# Patient Record
Sex: Female | Born: 1985 | Race: White | Hispanic: No | Marital: Married | Smoking: Current every day smoker
Health system: Southern US, Community
[De-identification: ages and names within clinical notes are randomized; demographics above are authoritative.]

## PROBLEM LIST (undated history)

## (undated) HISTORY — PX: TONSILLECTOMY: SUR1361

---
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
## 2007-03-26 ENCOUNTER — Emergency Department (HOSPITAL_COMMUNITY): Admission: EM | Admit: 2007-03-26 | Discharge: 2007-03-26 | Payer: Self-pay | Admitting: Emergency Medicine

## 2007-06-17 ENCOUNTER — Emergency Department (HOSPITAL_COMMUNITY): Admission: EM | Admit: 2007-06-17 | Discharge: 2007-06-17 | Payer: Self-pay | Admitting: Emergency Medicine

## 2007-08-14 ENCOUNTER — Emergency Department (HOSPITAL_COMMUNITY): Admission: EM | Admit: 2007-08-14 | Discharge: 2007-08-14 | Payer: Self-pay | Admitting: Emergency Medicine

## 2007-12-11 ENCOUNTER — Emergency Department (HOSPITAL_COMMUNITY): Admission: EM | Admit: 2007-12-11 | Discharge: 2007-12-11 | Payer: Self-pay | Admitting: Emergency Medicine

## 2008-04-27 ENCOUNTER — Other Ambulatory Visit: Admission: RE | Admit: 2008-04-27 | Discharge: 2008-04-27 | Payer: Self-pay | Admitting: Obstetrics & Gynecology

## 2008-05-05 ENCOUNTER — Ambulatory Visit (HOSPITAL_COMMUNITY): Admission: RE | Admit: 2008-05-05 | Discharge: 2008-05-05 | Payer: Self-pay | Admitting: Obstetrics & Gynecology

## 2008-06-05 ENCOUNTER — Emergency Department (HOSPITAL_COMMUNITY): Admission: EM | Admit: 2008-06-05 | Discharge: 2008-06-05 | Payer: Self-pay | Admitting: Emergency Medicine

## 2008-06-28 ENCOUNTER — Emergency Department (HOSPITAL_COMMUNITY): Admission: EM | Admit: 2008-06-28 | Discharge: 2008-06-28 | Payer: Self-pay | Admitting: Emergency Medicine

## 2008-07-15 ENCOUNTER — Emergency Department (HOSPITAL_COMMUNITY): Admission: EM | Admit: 2008-07-15 | Discharge: 2008-07-15 | Payer: Self-pay | Admitting: Emergency Medicine

## 2008-08-19 ENCOUNTER — Emergency Department (HOSPITAL_COMMUNITY): Admission: EM | Admit: 2008-08-19 | Discharge: 2008-08-19 | Payer: Self-pay | Admitting: Emergency Medicine

## 2008-08-25 ENCOUNTER — Emergency Department (HOSPITAL_COMMUNITY): Admission: EM | Admit: 2008-08-25 | Discharge: 2008-08-25 | Payer: Self-pay | Admitting: Emergency Medicine

## 2009-04-09 ENCOUNTER — Inpatient Hospital Stay (HOSPITAL_COMMUNITY): Admission: AD | Admit: 2009-04-09 | Discharge: 2009-04-09 | Payer: Self-pay | Admitting: Family Medicine

## 2009-04-09 ENCOUNTER — Ambulatory Visit: Payer: Self-pay | Admitting: Advanced Practice Midwife

## 2009-05-18 ENCOUNTER — Ambulatory Visit: Payer: Self-pay | Admitting: Obstetrics and Gynecology

## 2009-05-18 ENCOUNTER — Inpatient Hospital Stay (HOSPITAL_COMMUNITY): Admission: AD | Admit: 2009-05-18 | Discharge: 2009-05-21 | Payer: Self-pay | Admitting: Obstetrics & Gynecology

## 2009-06-17 ENCOUNTER — Inpatient Hospital Stay (HOSPITAL_COMMUNITY): Admission: AD | Admit: 2009-06-17 | Discharge: 2009-06-17 | Payer: Self-pay | Admitting: Obstetrics & Gynecology

## 2009-06-20 ENCOUNTER — Observation Stay (HOSPITAL_COMMUNITY): Admission: RE | Admit: 2009-06-20 | Discharge: 2009-06-22 | Payer: Self-pay | Admitting: Obstetrics and Gynecology

## 2009-08-14 ENCOUNTER — Emergency Department (HOSPITAL_COMMUNITY): Admission: EM | Admit: 2009-08-14 | Discharge: 2009-08-14 | Payer: Self-pay | Admitting: Emergency Medicine

## 2009-09-12 ENCOUNTER — Emergency Department (HOSPITAL_COMMUNITY): Admission: EM | Admit: 2009-09-12 | Discharge: 2009-09-13 | Payer: Self-pay | Admitting: Emergency Medicine

## 2009-09-27 ENCOUNTER — Emergency Department (HOSPITAL_COMMUNITY): Admission: EM | Admit: 2009-09-27 | Discharge: 2009-09-27 | Payer: Self-pay | Admitting: Emergency Medicine

## 2009-10-08 ENCOUNTER — Emergency Department (HOSPITAL_COMMUNITY): Admission: EM | Admit: 2009-10-08 | Discharge: 2009-10-08 | Payer: Self-pay | Admitting: Emergency Medicine

## 2010-01-26 ENCOUNTER — Inpatient Hospital Stay (HOSPITAL_COMMUNITY): Admission: AD | Admit: 2010-01-26 | Discharge: 2010-01-26 | Payer: Self-pay | Admitting: Obstetrics & Gynecology

## 2010-01-26 ENCOUNTER — Ambulatory Visit: Payer: Self-pay | Admitting: Nurse Practitioner

## 2010-02-27 ENCOUNTER — Emergency Department (HOSPITAL_COMMUNITY): Admission: EM | Admit: 2010-02-27 | Discharge: 2010-02-27 | Payer: Self-pay | Admitting: Emergency Medicine

## 2010-06-25 ENCOUNTER — Encounter: Payer: Self-pay | Admitting: Internal Medicine

## 2010-08-18 LAB — URINALYSIS, ROUTINE W REFLEX MICROSCOPIC
Bilirubin Urine: NEGATIVE
Ketones, ur: 15 mg/dL — AB
Nitrite: NEGATIVE
Protein, ur: NEGATIVE mg/dL
Urobilinogen, UA: 0.2 mg/dL (ref 0.0–1.0)
pH: 5.5 (ref 5.0–8.0)

## 2010-08-18 LAB — GC/CHLAMYDIA PROBE AMP, GENITAL
Chlamydia, DNA Probe: NEGATIVE
GC Probe Amp, Genital: NEGATIVE

## 2010-08-18 LAB — CBC
MCH: 31.1 pg (ref 26.0–34.0)
MCHC: 33.5 g/dL (ref 30.0–36.0)
Platelets: 346 10*3/uL (ref 150–400)
RDW: 17 % — ABNORMAL HIGH (ref 11.5–15.5)

## 2010-08-21 LAB — CBC
HCT: 37.9 % (ref 36.0–46.0)
Hemoglobin: 12.5 g/dL (ref 12.0–15.0)
MCHC: 33.1 g/dL (ref 30.0–36.0)
MCHC: 33.9 g/dL (ref 30.0–36.0)
MCV: 96.5 fL (ref 78.0–100.0)
RBC: 3.49 MIL/uL — ABNORMAL LOW (ref 3.87–5.11)
RBC: 3.92 MIL/uL (ref 3.87–5.11)
RDW: 14.9 % (ref 11.5–15.5)
RDW: 14.9 % (ref 11.5–15.5)

## 2010-08-21 LAB — DIFFERENTIAL
Basophils Absolute: 0 10*3/uL (ref 0.0–0.1)
Basophils Relative: 0 % (ref 0–1)
Eosinophils Absolute: 0.1 10*3/uL (ref 0.0–0.7)
Monocytes Absolute: 0.8 10*3/uL (ref 0.1–1.0)
Monocytes Relative: 7 % (ref 3–12)
Neutro Abs: 8.2 10*3/uL — ABNORMAL HIGH (ref 1.7–7.7)
Neutrophils Relative %: 68 % (ref 43–77)

## 2010-08-22 LAB — DIFFERENTIAL
Basophils Absolute: 0.1 10*3/uL (ref 0.0–0.1)
Basophils Relative: 1 % (ref 0–1)
Basophils Relative: 0 % (ref 0–1)
Eosinophils Absolute: 0.1 10*3/uL (ref 0.0–0.7)
Lymphocytes Relative: 36 % (ref 12–46)
Monocytes Absolute: 0.5 10*3/uL (ref 0.1–1.0)
Monocytes Relative: 7 % (ref 3–12)
Monocytes Relative: 8 % (ref 3–12)
Neutro Abs: 6.2 10*3/uL (ref 1.7–7.7)
Neutro Abs: 4 10*3/uL (ref 1.7–7.7)
Neutrophils Relative %: 61 % (ref 43–77)

## 2010-08-22 LAB — CBC
HCT: 41 % (ref 36.0–46.0)
HCT: 35.1 % — ABNORMAL LOW (ref 36.0–46.0)
Hemoglobin: 13.6 g/dL (ref 12.0–15.0)
Platelets: 323 10*3/uL (ref 150–400)
RBC: 4.4 MIL/uL (ref 3.87–5.11)
RDW: 16 % — ABNORMAL HIGH (ref 11.5–15.5)

## 2010-08-22 LAB — COMPREHENSIVE METABOLIC PANEL
ALT: 17 U/L (ref 0–35)
AST: 20 U/L (ref 0–37)
Albumin: 3.6 g/dL (ref 3.5–5.2)
Alkaline Phosphatase: 61 U/L (ref 39–117)
Alkaline Phosphatase: 52 U/L (ref 39–117)
BUN: 8 mg/dL (ref 6–23)
BUN: 5 mg/dL — ABNORMAL LOW (ref 6–23)
CO2: 29 mEq/L (ref 19–32)
Chloride: 102 mEq/L (ref 96–112)
GFR calc Af Amer: >60 mL/min (ref >60)
GFR calc non Af Amer: >60 mL/min (ref >60)
Glucose, Bld: 90 mg/dL (ref 70–99)
Potassium: 3.9 mEq/L (ref 3.5–5.1)
Potassium: 3.5 mEq/L (ref 3.5–5.1)
Sodium: 138 mEq/L (ref 135–145)
Total Bilirubin: 0.8 mg/dL (ref 0.3–1.2)
Total Protein: 6.2 g/dL (ref 6.0–8.3)

## 2010-08-22 LAB — RAPID URINE DRUG SCREEN, HOSP PERFORMED
Benzodiazepines: NOT DETECTED
Cocaine: NOT DETECTED
Cocaine: NOT DETECTED
Opiates: POSITIVE — AB
Tetrahydrocannabinol: POSITIVE — AB
Tetrahydrocannabinol: POSITIVE — AB

## 2010-08-22 LAB — POCT PREGNANCY, URINE: Preg Test, Ur: NEGATIVE

## 2010-08-22 LAB — URINALYSIS, ROUTINE W REFLEX MICROSCOPIC
Ketones, ur: NEGATIVE mg/dL
Nitrite: NEGATIVE
Urobilinogen, UA: 1 mg/dL (ref 0.0–1.0)

## 2010-08-22 LAB — PREGNANCY, URINE: Preg Test, Ur: NEGATIVE

## 2010-08-22 LAB — ETHANOL
Alcohol, Ethyl (B): <5 mg/dL (ref 0–10)
Alcohol, Ethyl (B): <5 mg/dL (ref 0–10)

## 2010-08-23 LAB — BASIC METABOLIC PANEL
BUN: 9 mg/dL (ref 6–23)
Creatinine, Ser: 0.73 mg/dL (ref 0.4–1.2)
GFR calc non Af Amer: >60 mL/min (ref >60)
Potassium: 3.7 mEq/L (ref 3.5–5.1)

## 2010-08-23 LAB — RAPID URINE DRUG SCREEN, HOSP PERFORMED
Amphetamines: NOT DETECTED
Barbiturates: NOT DETECTED
Benzodiazepines: NOT DETECTED
Opiates: POSITIVE — AB

## 2010-08-23 LAB — DIFFERENTIAL
Lymphocytes Relative: 34 % (ref 12–46)
Lymphs Abs: 2.9 10*3/uL (ref 0.7–4.0)
Neutro Abs: 4.7 10*3/uL (ref 1.7–7.7)
Neutrophils Relative %: 56 % (ref 43–77)

## 2010-08-23 LAB — CBC
Platelets: 315 10*3/uL (ref 150–400)
WBC: 8.4 10*3/uL (ref 4.0–10.5)

## 2010-09-04 LAB — CBC
MCHC: 34.3 g/dL (ref 30.0–36.0)
MCV: 100.6 fL — ABNORMAL HIGH (ref 78.0–100.0)
Platelets: 297 10*3/uL (ref 150–400)

## 2010-09-05 LAB — URINALYSIS, ROUTINE W REFLEX MICROSCOPIC
Glucose, UA: NEGATIVE mg/dL
Ketones, ur: NEGATIVE mg/dL
Specific Gravity, Urine: <1.005 — ABNORMAL LOW (ref 1.005–1.030)
pH: 6.5 (ref 5.0–8.0)

## 2010-09-05 LAB — RAPID URINE DRUG SCREEN, HOSP PERFORMED
Benzodiazepines: NOT DETECTED
Cocaine: NOT DETECTED
Opiates: POSITIVE — AB

## 2010-09-05 LAB — RPR: RPR Ser Ql: NONREACTIVE

## 2010-09-05 LAB — CBC
MCHC: 33.8 g/dL (ref 30.0–36.0)
MCV: 100 fL (ref 78.0–100.0)
Platelets: 343 10*3/uL (ref 150–400)
RDW: 14.3 % (ref 11.5–15.5)
WBC: 12.5 10*3/uL — ABNORMAL HIGH (ref 4.0–10.5)

## 2010-09-05 LAB — WET PREP, GENITAL: Yeast Wet Prep HPF POC: NONE SEEN

## 2010-09-06 LAB — URINALYSIS, ROUTINE W REFLEX MICROSCOPIC
Bilirubin Urine: NEGATIVE
Hgb urine dipstick: NEGATIVE
Ketones, ur: NEGATIVE mg/dL
Nitrite: NEGATIVE
Protein, ur: NEGATIVE mg/dL
Specific Gravity, Urine: 1.015 (ref 1.005–1.030)
Urobilinogen, UA: 0.2 mg/dL (ref 0.0–1.0)

## 2010-09-14 LAB — URINALYSIS, ROUTINE W REFLEX MICROSCOPIC
Bilirubin Urine: NEGATIVE
Glucose, UA: NEGATIVE mg/dL
Hgb urine dipstick: NEGATIVE
Specific Gravity, Urine: 1.009 (ref 1.005–1.030)
pH: 8 (ref 5.0–8.0)

## 2010-09-14 LAB — POCT PREGNANCY, URINE: Preg Test, Ur: NEGATIVE

## 2010-10-20 ENCOUNTER — Emergency Department (HOSPITAL_COMMUNITY)
Admission: EM | Admit: 2010-10-20 | Discharge: 2010-10-21 | Disposition: A | Discharge status: Home / Self Care | Payer: Self-pay | Attending: Emergency Medicine | Admitting: Emergency Medicine

## 2010-10-20 DIAGNOSIS — K089 Disorder of teeth and supporting structures, unspecified: Secondary | ICD-10-CM | POA: Insufficient documentation

## 2010-10-20 DIAGNOSIS — Z8614 Personal history of Methicillin resistant Staphylococcus aureus infection: Secondary | ICD-10-CM | POA: Insufficient documentation

## 2010-10-20 DIAGNOSIS — K029 Dental caries, unspecified: Secondary | ICD-10-CM | POA: Insufficient documentation

## 2011-02-26 LAB — BASIC METABOLIC PANEL
CO2: 25
Calcium: 8.3 — ABNORMAL LOW
Chloride: 109
Creatinine, Ser: 0.64
GFR calc Af Amer: >60
Glucose, Bld: 99

## 2011-02-26 LAB — RAPID URINE DRUG SCREEN, HOSP PERFORMED
Amphetamines: NOT DETECTED
Barbiturates: NOT DETECTED
Benzodiazepines: POSITIVE — AB
Opiates: NOT DETECTED

## 2011-03-14 LAB — WOUND CULTURE: Gram Stain: NONE SEEN

## 2012-01-03 ENCOUNTER — Other Ambulatory Visit: Payer: Self-pay

## 2012-08-12 ENCOUNTER — Inpatient Hospital Stay (HOSPITAL_COMMUNITY)
Admission: AD | Admit: 2012-08-12 | Discharge: 2012-08-12 | Discharge status: Against Medical Advice | Payer: Self-pay | Source: Ambulatory Visit | Attending: Obstetrics & Gynecology | Admitting: Obstetrics & Gynecology

## 2012-08-12 DIAGNOSIS — G8929 Other chronic pain: Secondary | ICD-10-CM | POA: Clinically undetermined

## 2012-08-12 DIAGNOSIS — N949 Unspecified condition associated with female genital organs and menstrual cycle: Secondary | ICD-10-CM | POA: Insufficient documentation

## 2012-08-12 DIAGNOSIS — N938 Other specified abnormal uterine and vaginal bleeding: Secondary | ICD-10-CM | POA: Insufficient documentation

## 2012-08-12 NOTE — MAU Note (Signed)
Period started 2 wks ago, started as spotting, off and on now is heavy. Changing every 2 hrs.

## 2012-08-12 NOTE — MAU Note (Signed)
Not in lobby

## 2012-08-12 NOTE — MAU Note (Signed)
Name and DOB verified.  correct Spelling confirmed  On ID band by pt.

## 2012-08-12 NOTE — MAU Note (Signed)
Not in lobby #2 

## 2012-08-12 NOTE — MAU Note (Signed)
Not in lobby #3 

## 2013-04-23 ENCOUNTER — Emergency Department (HOSPITAL_COMMUNITY)
Admission: EM | Admit: 2013-04-23 | Discharge: 2013-04-23 | Disposition: A | Discharge status: Home / Self Care | Payer: Medicaid Other | Attending: Emergency Medicine | Admitting: Emergency Medicine

## 2013-04-23 DIAGNOSIS — G479 Sleep disorder, unspecified: Secondary | ICD-10-CM | POA: Insufficient documentation

## 2013-04-23 DIAGNOSIS — K0889 Other specified disorders of teeth and supporting structures: Secondary | ICD-10-CM

## 2013-04-23 DIAGNOSIS — R110 Nausea: Secondary | ICD-10-CM | POA: Insufficient documentation

## 2013-04-23 DIAGNOSIS — K089 Disorder of teeth and supporting structures, unspecified: Secondary | ICD-10-CM | POA: Insufficient documentation

## 2013-04-23 MED ORDER — PENICILLIN V POTASSIUM 500 MG PO TABS: 500.0000 mg | ORAL_TABLET | Freq: Four times a day (QID) | ORAL | Status: DC

## 2013-04-23 MED ORDER — TRAMADOL HCL 50 MG PO TABS: 50.0000 mg | ORAL_TABLET | Freq: Four times a day (QID) | ORAL | Status: DC | PRN

## 2013-04-23 NOTE — ED Provider Notes (Signed)
Medical screening examination/treatment/procedure(s) were performed by non-physician practitioner and as supervising physician I was immediately available for consultation/collaboration.  Flint Melter, MD 04/23/13 [phone]

## 2013-04-23 NOTE — ED Provider Notes (Signed)
CSN: [id number]     Arrival date & time 04/23/13  1547 History  This chart was scribed for non-physician practitioner working with Flint Melter, MD by Ashley Jacobs, ED scribe. This patient was seen in room WTR6/WTR6 and the patient's care was started at 4:22 PM.   First MD Initiated Contact with Patient 04/23/13 1557     Chief Complaint  Patient presents with  . Dental Pain   (Consider location/radiation/quality/duration/timing/severity/associated sxs/prior Treatment) The history is provided by the patient and medical records. No language interpreter was used.   HPI Comments: Amber Dillon is a 27 y.o. female who presents to the Emergency Department complaining of left third molar pain for the past week. The pain is described as sharp, needle-like sensation that radiates throughout the left side of her face. She has tried Orajel with no relief. Pt has cold sensitivity, pain with talking and nausea. She denies fever and vomiting. Pt does not have any known allergies to medicine or any prior medical complications.   No past medical history on file. No past surgical history on file. No family history on file. History  Substance Use Topics  . Smoking status: Not on file  . Smokeless tobacco: Not on file  . Alcohol Use: Not on file   OB History   No data available     Review of Systems  Constitutional: Negative for fever and chills.  HENT: Positive for dental problem.   Gastrointestinal: Positive for nausea. Negative for vomiting.  Psychiatric/Behavioral: Positive for sleep disturbance.  All other systems reviewed and are negative.    Allergies  Review of patient's allergies indicates no known allergies.  Home Medications   Current Outpatient Rx  Name  Route  Sig  Dispense  Refill  . aspirin-acetaminophen-caffeine (EXCEDRIN MIGRAINE) 250-250-65 MG per tablet   Oral   Take 1-2 tablets by mouth every 6 (six) hours as needed for headache (toothpain).          BP  103/69  Pulse 93  Temp(Src) 98.1 F (36.7 C) (Oral)  Resp 16  SpO2 99% Physical Exam  Nursing note and vitals reviewed. Constitutional: She is oriented to person, place, and time. She appears well-developed and well-nourished. No distress.  HENT:  Head: Normocephalic and atraumatic.  Mouth/Throat: Uvula is midline and oropharynx is clear and moist.    No trismus, submental edema, tongue elevation. No drainable abscess.  Poor dentition, left lower molar is rotted out.   Eyes: EOM are normal. Pupils are equal, round, and reactive to light.  Neck: Normal range of motion. Neck supple. No tracheal deviation present.  Cardiovascular: Normal rate.   Pulmonary/Chest: Effort normal. No respiratory distress.  Abdominal: Soft. She exhibits no distension.  Musculoskeletal: Normal range of motion.  Neurological: She is alert and oriented to person, place, and time.  Skin: Skin is warm and dry.  Psychiatric: She has a normal mood and affect. Her behavior is normal.    ED Course  Dental Date/Time: 04/23/2013 4:36 PM Performed by: Mora Bellman Authorized by: Mora Bellman Consent: Verbal consent obtained. written consent not obtained. The procedure was performed in an emergent situation. Risks and benefits: risks, benefits and alternatives were discussed Consent given by: patient Patient understanding: patient states understanding of the procedure being performed Patient consent: the patient's understanding of the procedure matches consent given Required items: required blood products, implants, devices, and special equipment available Patient identity confirmed: verbally with patient and arm band Time out: Immediately prior to procedure  a "time out" was called to verify the correct patient, procedure, equipment, support staff and site/side marked as required. Local anesthesia used: yes Anesthesia: local infiltration Local anesthetic: bupivacaine 0.25% with epinephrine Anesthetic  total: 1.8 ml Patient sedated: no Patient tolerance: Patient tolerated the procedure well with no immediate complications.   (including critical care time) DIAGNOSTIC STUDIES: Oxygen Saturation is 99% on room air, normal by my interpretation.    COORDINATION OF CARE: 4:24 PM Discussed course of care with pt which includes dental block. Pt understands and agrees.   Labs Review Labs Reviewed - No data to display Imaging Review No results found.  EKG Interpretation   None       MDM   1. Pain, dental    Patient with toothache.  No gross abscess.  Exam unconcerning for Ludwig's angina or spread of infection.  Will treat with penicillin and pain medicine.  Urged patient to follow-up with dentist.     I personally performed the services described in this documentation, which was scribed in my presence. The recorded information has been reviewed and is accurate.     Mora Bellman, PA-C 04/23/13 [phone]

## 2013-04-23 NOTE — ED Notes (Signed)
Pt c/o dental pain to L side of mouth for one week. Pt states she has used orajel and Motrin to no avail. Pt with no acute distress.

## 2013-04-23 NOTE — Progress Notes (Signed)
   CARE MANAGEMENT ED NOTE 04/23/2013  Patient:  Amber Dillon, Amber Dillon   Account Number:  [id number]  Date Initiated:  04/23/2013  Documentation initiated by:  Edd Arbour  Subjective/Objective Assessment:   27 yr old female medicaid [place] access states pcp is [facility]     Subjective/Objective Assessment Detail:     Action/Plan:   EPIc updated   Action/Plan Detail:   Anticipated DC Date:  04/23/2013     Status Recommendation to Physician:   Result of Recommendation:    Other ED Services  Consult Working Plan    DC Planning Services  Other  PCP issues    Choice offered to / List presented to:            Status of service:  Completed, signed off  ED Comments:   ED Comments Detail:

## 2013-09-22 ENCOUNTER — Encounter (HOSPITAL_COMMUNITY): Payer: Self-pay | Admitting: Emergency Medicine

## 2013-09-22 ENCOUNTER — Emergency Department (HOSPITAL_COMMUNITY)
Admission: EM | Admit: 2013-09-22 | Discharge: 2013-09-22 | Disposition: A | Discharge status: Home / Self Care | Payer: Medicaid Other | Attending: Emergency Medicine | Admitting: Emergency Medicine

## 2013-09-22 DIAGNOSIS — M549 Dorsalgia, unspecified: Secondary | ICD-10-CM | POA: Insufficient documentation

## 2013-09-22 DIAGNOSIS — G5712 Meralgia paresthetica, left lower limb: Secondary | ICD-10-CM

## 2013-09-22 DIAGNOSIS — G8929 Other chronic pain: Secondary | ICD-10-CM | POA: Insufficient documentation

## 2013-09-22 DIAGNOSIS — F17200 Nicotine dependence, unspecified, uncomplicated: Secondary | ICD-10-CM | POA: Insufficient documentation

## 2013-09-22 DIAGNOSIS — G5710 Meralgia paresthetica, unspecified lower limb: Secondary | ICD-10-CM | POA: Insufficient documentation

## 2013-09-22 MED ORDER — GABAPENTIN 300 MG PO CAPS: 300.0000 mg | ORAL_CAPSULE | Freq: Every day | ORAL | Status: DC

## 2013-09-22 MED ORDER — OXYCODONE-ACETAMINOPHEN 5-325 MG PO TABS
1.0000 | ORAL_TABLET | Freq: Once | ORAL | Status: AC
  Administered 2013-09-22: 1 via ORAL
  Filled 2013-09-22: qty 1

## 2013-09-22 NOTE — ED Provider Notes (Signed)
CSN: [id number]     Arrival date & time 09/22/13  1757 History  This chart was scribed for non-physician practitioner, Coral CeoJessica Nakyiah Kuck, PA-C, working with Gavin PoundMichael Y. Oletta LamasGhim, MD by Smiley HousemanFallon Davis, ED Scribe. This patient was seen in room TR05C/TR05C and the patient's care was started at 7:55 PM.  Chief Complaint  Patient presents with  . Leg Pain   The history is provided by the patient. No language interpreter was used.   HPI Comments: Bronson IngBrittany Dillon is a 28 y.o. female who presents to the Emergency Department complaining of intermittent worsening left upper thigh pain that started about 2 months ago. Pt states the pain initially started about 4 years ago when she was pregnant and resolved, but within the last few months it has returned. Pt states the pain can be sharp or burning. Pt denies any injury to trauma. Pt states she has associated swelling. Has been taking Ibuprofen with no relief. She states ambulating is difficult due to the pain. Pain also worse with light palpation. Pt states she has back pain, which is her baseline. She denies pain radiating down the back of her leg. She denies bowel or bladder incontinence, dysuria, fevers, chills, nausea, emesis, abdominal pain, weakness or loss of sensation. States top of her thigh feels numb. Pt states she is otherwise healthy. She reports her gynecologist prescribed her medication for the pain in the past. Pt was informed it was related to her nerves.      History reviewed. No pertinent past medical history. History reviewed. No pertinent past surgical history. History reviewed. No pertinent family history. History  Substance Use Topics  . Smoking status: Current Every Day Smoker  . Smokeless tobacco: Not on file  . Alcohol Use: No   OB History   Grav Para Term Preterm Abortions TAB SAB Ect Mult Living                 Review of Systems  Constitutional: Negative for fever and chills.  Respiratory: Negative for shortness of breath.    Cardiovascular: Negative for chest pain and leg swelling.  Gastrointestinal: Negative for nausea, vomiting, abdominal pain and diarrhea.  Musculoskeletal: Positive for back pain (chronic). Negative for joint swelling, myalgias, neck pain and neck stiffness.       Left leg pain.  Skin: Negative for color change, rash and wound.  Neurological: Positive for numbness. Negative for weakness and headaches.  Psychiatric/Behavioral: Negative for behavioral problems and confusion.  All other systems reviewed and are negative.   Allergies  Review of patient's allergies indicates no known allergies.  Home Medications   Prior to Admission medications   Medication Sig Start Date End Date Taking? Authorizing Provider  aspirin-acetaminophen-caffeine (EXCEDRIN MIGRAINE) [id number] MG per tablet Take 1-2 tablets by mouth every 6 (six) hours as needed for headache (toothpain).   Yes Historical Provider, MD   Triage Vitals: BP 130/87  Pulse 88  Temp(Src) 98.4 F (36.9 C)  Resp 18  SpO2 97%  LMP 09/17/2013  Filed Vitals:   09/22/13 1805 09/22/13 2030  BP: 130/87 97/59  Pulse: 88 68  Temp: 98.4 F (36.9 C)   Resp: 18 16  SpO2: 97% 99%    Physical Exam  Nursing note and vitals reviewed. Constitutional: She is oriented to person, place, and time. She appears well-developed and well-nourished. No distress.  HENT:  Head: Normocephalic and atraumatic.  Right Ear: External ear normal.  Left Ear: External ear normal.  Mouth/Throat: Oropharynx is clear and moist.  Eyes: Conjunctivae are normal. Right eye exhibits no discharge. Left eye exhibits no discharge.  Neck: Neck supple.  Cardiovascular: Normal rate, regular rhythm, normal heart sounds and intact distal pulses.  Exam reveals no gallop and no friction rub.   No murmur heard. Dorsalis pedis pulses present and equal bilaterally  Pulmonary/Chest: Effort normal and breath sounds normal. No respiratory distress. She has no wheezes. She has no  rales. She exhibits no tenderness.  Abdominal: Soft. She exhibits no distension. There is no tenderness.  Musculoskeletal: Normal range of motion. She exhibits tenderness. She exhibits no edema.       Legs: Tenderness to palpation to the lateral and anterior left thigh. Pain worse with ambulation. Patient able to ambulate without difficulty or ataxia. ROM intact in the LE bilaterally. Strength 5/5 in the lower extremities. No leg edema, erythema, ecchymosis or wounds. Altered sensation on the lateral thigh compared to the medial, however, sensation is intact. No hip, knee, calf, ankle or foot tenderness. No lumbar tenderness to palpation.  Neurological: She is alert and oriented to person, place, and time.  Skin: Skin is warm and dry. She is not diaphoretic. No erythema.    ED Course  Procedures (including critical care time) DIAGNOSTIC STUDIES: Oxygen Saturation is 97% on RA, normal by my interpretation.    COORDINATION OF CARE: 8:26 PM-Will order Percocet.  Will discharge with gabapentin.  Patient informed of current plan of treatment and evaluation and agrees with plan.     MDM   [name] is a 28 y.o. female who presents to the Emergency Department complaining of intermittent worsening left upper thigh pain that started about 2 months ago. Pain possibly due to meralgia paresthetica or lateral femoral cutaneous neuropathy. Patient describes a burning sensation and altered sensation. No injuries or trauma. Patient neurovascularly intact. No evidence of an infections process/cellulitis. No evidence of a DVT. Patient has failed NSAID therapy at home with no relief. Will try starting gabapentin. Instructed to follow-up with PCP for further evaluation and management. Return precautions, discharge instructions, and follow-up was discussed with the patient before discharge.     Discharge Medication List as of 09/22/2013  8:28 PM    START taking these medications   Details  gabapentin  (NEURONTIN) 300 MG capsule Take 1 capsule (300 mg total) by mouth daily., Starting 09/22/2013, Until Discontinued, Print        Final impressions: 1. Meralgia paresthetica of left side      Luiz IronJessica Katlin Arohi Salvatierra PA-C   This patient was discussed with Dr. Jyl HeinzGhim       Geary Rufo K Donnesha Karg, PA-C 09/24/13 1234

## 2013-09-22 NOTE — ED Notes (Signed)
Per pt sts 2 months of upper left leg pain. sts pain and numbness. sts it actually started about 4 years ago when she was pregnant.

## 2013-09-22 NOTE — Discharge Instructions (Signed)
- Your symptoms may be due to metralgia paresthetica or lateral femoral cutaneous nerve entrapment  - This nerve supplies sensation to the surface of your outer thigh and can become compressed or "pinched." The lateral femoral cutaneous nerve is purely a sensory nerve and doesn't affect your ability to use your leg muscles. - Avoid wearing tight clothes or belts (this may make your symptoms worse) - Take Gabapentin for pain  - Return to the emergency department if you develop any changing/worsening condition, weakness, loss of bowel/bladder function, fever, or any other concerns (please read additional information regarding your condition below)   Emergency Department Resource Guide 1) Find a Doctor and Pay Out of Pocket Although you won't have to find out who is covered by your insurance plan, it is a good idea to ask around and get recommendations. You will then need to call the office and see if the doctor you have chosen will accept you as a new patient and what types of options they offer for patients who are self-pay. Some doctors offer discounts or will set up payment plans for their patients who do not have insurance, but you will need to ask so you aren't surprised when you get to your appointment.  2) Contact Your Local Health Department Not all health departments have doctors that can see patients for sick visits, but many do, so it is worth a call to see if yours does. If you don't know where your local health department is, you can check in your phone book. The CDC also has a tool to help you locate your state's health department, and many state websites also have listings of all of their local health departments.  3) Find a Walk-in Clinic If your illness is not likely to be very severe or complicated, you may want to try a walk in clinic. These are popping up all over the country in pharmacies, drugstores, and shopping centers. They're usually staffed by nurse practitioners or physician  assistants that have been trained to treat common illnesses and complaints. They're usually fairly quick and inexpensive. However, if you have serious medical issues or chronic medical problems, these are probably not your best option.  No Primary Care Doctor: - Call Health Connect at  [phone] - they can help you locate a primary care doctor that  accepts your insurance, provides certain services, etc. - Physician Referral Service- [phone]  Chronic Pain Problems: Organization         Address  Phone   Notes  [facility]  [phone]) [phone] Patients need to be referred by their primary care doctor.   Medication Assistance: Organization         Address  Phone   Notes  [facility] Medication [facility] [address] [place], [place] [address] [phone]) [phone] --Must be a resident of [facility] -- Must have NO insurance coverage whatsoever (no Medicaid/ Medicare, etc.) -- The pt. MUST have a primary care doctor that directs their care regularly and follows them in the community   MedAssist  [phone]   [employer]  [phone]    Agencies that provide inexpensive medical care: Organization         Address  Phone   Notes  [facility]  [phone]   [facility]    [phone]   [facility] [address] [place], [place] [address] [phone]) [phone]   [facility] [address], [place] [phone]) [phone]   Planned Parenthood    [phone]   [facility]    [phone](336) [phone]   [facility]  [address], [place] Phone:  [phone], Fax:  [phone] Hours of Operation:  9 am - 6 pm, M-F.  Also accepts Medicaid/Medicare and self-pay.  [facility]  [address], [place] Phone: [phone](336) [phone], Fax: [phone]. Hours of Operation:  8:30 am - 5:30 pm, M-F.  Also accepts  Medicaid and self-pay.  [facility] [place] [address], [place] Phone: [phone](336) [phone]   [facility] [address], [place], [place] [phone], Ext. [phone] Mondays & Thursdays: 7-9 AM.  First 15 patients are seen on a first come, first serve basis.    Medicaid-accepting [facility] Providers:  Organization         Address  Phone   Notes  [facility] [address], [place] [phone] Also accepts self-pay patients.  [facility] [address], [place]  [phone]) [phone]   [facility] [address], [place] [phone]) [phone]   [facility] [address] [place] [address], [place] [phone]) [phone]   Renaye RakersVeita Bland [address], [place]   [phone]) [phone] Only accepts [place] Access [place] patients after they have their name applied to their card.   Self-Pay (no insurance) in [facility] [place]:  Organization         Address  Phone   Notes  Sickle Cell Patients, [facility] [address], [place] [phone]   [facility] [address], [place] [phone]   [facility] [place]  [address], [place] [phone]) [phone]   [facility]/Dr. Osei-Bonsu  [address] [place] [address], [place] or [phone], [place] [phone]) [phone] Phone number for both [place] and [place] locations is the same.  [facility] [address], [place] [phone]) [phone]   [facility] [address] [place] [address], [place] or [address] [phone]) [phone]) [phone]   [facility] [address], [place] [phone]) [phone], phone; [phone]) [phone], fax Sees patients 1st and 3rd Saturday of every month.  Must not qualify for public or private insurance (i.e. Medicaid, Medicare, [place] Health Choice, Veterans' Benefits)  Household  income should be no more than 200% of the poverty level The clinic cannot treat you if you are pregnant or think you are pregnant  Sexually transmitted diseases are not treated at the clinic.    Dental Care: Organization         Address  Phone  Notes  [facility] Department of [facility] [address], [place] [phone]) [phone] Accepts children up to age 26 who are enrolled in [place] or [place] Health Choice; pregnant women with a Medicaid card; and children who have applied for Medicaid or [place] Health Choice, but were declined, whose parents can pay a reduced fee at time of service.  [facility] of [facility] [place]  [address], [place] [phone]) [phone] Accepts children up to age 26 who are enrolled in [place] or [place] Health Choice; pregnant women with a Medicaid card; and children who have  applied for Medicaid or [place] Health Choice, but were declined, whose parents can pay a reduced fee at time of service.  [facility]  [address] [place], [place] [phone] Patients are seen by appointment only. Walk-ins are not accepted. [facility] will see patients 2 years of age and older. Monday - Tuesday (8am-5pm) Most Wednesdays (8:30-5pm) $30 per visit, cash only  [facility] Access PRO[facility]  [address], [facility] [phone] Patients are seen by appointment only. Walk-ins are not accepted. [facility] will see patients 59 years of age and older. One Wednesday Evening (Monthly: Volunteer Based).  $30 per visit, cash only  [employer]  [phone] for adults; Children under age 8, call Graduate Pediatric Dentistry at [phone]. Children aged 32-14, please call [phone] to request a pediatric application.  Dental services are provided in all areas of dental care including fillings, crowns and bridges, complete and partial dentures, implants, gum  treatment, root canals, and extractions. Preventive care is also provided. Treatment is provided to both adults and children. Patients are selected via a lottery and there is often a waiting list.   [facility] [address] [place]  [phone] [email or web address]   [facility] [address] [place], [place] [phone], Ext. [phone] Second and Fourth Thursday of each month, opens at 6:30 AM; Clinic ends at 9 AM.  Patients are seen on a first-come first-served basis, and a limited number are seen during each clinic.   [facility]  [address] [place], [place] [phone]   Eligibility Requirements You must have lived in [place], [place], or [place] counties for at least the last three months.   You cannot be eligible for state or federal sponsored [place], including [employer], [place], or [employer].   You generally cannot be eligible for healthcare insurance through your employer.    How to apply: Eligibility screenings are held every Tuesday and Wednesday afternoon from 1:00 pm until 4:00 pm. You do not need an appointment for the interview!  [facility] [address], [place], [place] [phone]   [facility] Health Department  [phone]   [facility] Health Department  [phone]   [facility]  [phone]    Behavioral Health Resources in the Community: Intensive Outpatient Programs Organization         Address  Phone  Notes  [facility] [address], [place], [place] [phone]   [facility] Outpatient [address], [place], [place] [phone]   [facility]: [facility] Drug S[facility] [address], [place], [place]  [phone]   [facility] Mental Health [address],  [place], [place] [phone] or [phone]   Substance Abuse Resources Organization         Address  Phone  Notes  Alcohol and  Drug Services  [phone]   [facility]  [phone]   [facility]  [phone]   Floydene Flock  [phone]   Residential & Outpatient Substance Abuse Program  [phone]   Psychological Services Organization         Address  Phone  Notes  [facility]  [phone]   [facility]  [phone]   [facility] [address], [place] [phone] or [phone]    Mobile Crisis Teams Organization  Address  Phone  Notes  [facility], Mobile Crisis Care Unit  [phone]   [facility]  [address] [place], [place]   [facility] [address] [place] [phone]    Self-Help/Support Groups Organization         Address  Phone             Notes  [place] of [place] - variety of support groups  [place] Call for more information  Narcotics Anonymous (NA), [facility] [address], [employer] [place]  2 meetings at this location   Special educational needs teacher         Address  Phone  Notes  ASAP Residential Treatment [place],    [place]  [phone]   [facility]  [address], [place] [address]3810, [place], [place]   [place], [place] [phone] Admissions: 8am-3pm M-F  Incentives Substance [name] [address]    [place], [place] [phone]   [facility] [address] [place], [place], [place]   [facility] [address]  [place], [place]   [facility] Programs - Intensive Outpatient [address], [place], [place]   [facility] ([place] [address]  [place], [place] [phone] or [phone]   [facility] (RTS) [address], [place], [place] Accepts Medicaid  Fellowship  [place] [address],  [place] [phone] Substance Abuse/Addiction Treatment   [facility] Organization         Address  Phone  Notes  [facility]  [phone]   Domenic Schwab, PhD [address]lis [address]a [place], [place]   [phone] or [phone]   [place], [place] [phone]   [facility] [address], [place], [place] [phone] Insurance/Medicaid/sponsorship through [facility] and Families [address] [place]                                    [place], [place] [phone] [place] [address], [place] [phone]    Dr. Adele Schilder  [phone]   [facility] Dept. 1) [address], [place] 2) [place] 3)  [address], [place] [phone]  [phone]   [place] [phone] or [phone] (After Hours)

## 2013-09-26 NOTE — ED Provider Notes (Signed)
Medical screening examination/treatment/procedure(s) were performed by non-physician practitioner and as supervising physician I was immediately available for consultation/collaboration.  Slayton Lubitz Y. Roshaunda Starkey, MD 09/26/13 1416 

## 2014-05-29 ENCOUNTER — Emergency Department: Payer: Self-pay | Admitting: Emergency Medicine

## 2014-05-29 LAB — CBC
HCT: 35.6 % (ref 35.0–47.0)
HGB: 11.9 g/dL — ABNORMAL LOW (ref 12.0–16.0)
MCH: 32.3 pg (ref 26.0–34.0)
MCHC: 33.6 g/dL (ref 32.0–36.0)
MCV: 96 fL (ref 80–100)
Platelet: 374 10*3/uL (ref 150–440)
RBC: 3.7 10*6/uL — ABNORMAL LOW (ref 3.80–5.20)
RDW: 13.9 % (ref 11.5–14.5)
WBC: 15.8 10*3/uL — ABNORMAL HIGH (ref 3.6–11.0)

## 2014-05-29 LAB — DRUG SCREEN, URINE
Amphetamines, Ur Screen: NEGATIVE (ref ?–1000)
Barbiturates, Ur Screen: NEGATIVE (ref ?–200)
Benzodiazepine, Ur Scrn: NEGATIVE (ref ?–200)
COCAINE METABOLITE, UR ~~LOC~~: NEGATIVE (ref ?–300)
Cannabinoid 50 Ng, Ur ~~LOC~~: POSITIVE (ref ?–50)
MDMA (Ecstasy)Ur Screen: NEGATIVE (ref ?–500)
Methadone, Ur Screen: POSITIVE (ref ?–300)
OPIATE, UR SCREEN: NEGATIVE (ref ?–300)
PHENCYCLIDINE (PCP) UR S: NEGATIVE (ref ?–25)
TRICYCLIC, UR SCREEN: NEGATIVE (ref ?–1000)

## 2014-05-29 LAB — COMPREHENSIVE METABOLIC PANEL
ALT: 25 U/L
Albumin: 3.4 g/dL (ref 3.4–5.0)
Alkaline Phosphatase: 53 U/L
Anion Gap: 9 (ref 7–16)
BUN: 11 mg/dL (ref 7–18)
Bilirubin,Total: 0.7 mg/dL (ref 0.2–1.0)
CALCIUM: 8.7 mg/dL (ref 8.5–10.1)
CREATININE: 0.69 mg/dL (ref 0.60–1.30)
Chloride: 102 mmol/L (ref 98–107)
Co2: 24 mmol/L (ref 21–32)
EGFR (Non-African Amer.): >60
GLUCOSE: 128 mg/dL — AB (ref 65–99)
OSMOLALITY: 271 (ref 275–301)
POTASSIUM: 3.2 mmol/L — AB (ref 3.5–5.1)
SGOT(AST): 20 U/L (ref 15–37)
Sodium: 135 mmol/L — ABNORMAL LOW (ref 136–145)
Total Protein: 7.5 g/dL (ref 6.4–8.2)

## 2014-05-29 LAB — URINALYSIS, COMPLETE
BACTERIA: NONE SEEN
Bilirubin,UR: NEGATIVE
Glucose,UR: NEGATIVE mg/dL (ref 0–75)
LEUKOCYTE ESTERASE: NEGATIVE
Nitrite: NEGATIVE
Ph: 6 (ref 4.5–8.0)
Protein: 100
SPECIFIC GRAVITY: 1.033 (ref 1.003–1.030)

## 2014-05-29 LAB — ACETAMINOPHEN LEVEL: Acetaminophen: <2 ug/mL

## 2014-05-29 LAB — HCG, QUANTITATIVE, PREGNANCY: Beta Hcg, Quant.: 82906 m[IU]/mL — ABNORMAL HIGH

## 2014-05-29 LAB — SALICYLATE LEVEL: Salicylates, Serum: 1.8 mg/dL

## 2014-05-29 LAB — ETHANOL: Ethanol: <3 mg/dL

## 2014-06-04 NOTE — L&D Delivery Note (Signed)
Delivery Note  Requested by Dr. Eure to attend this Stat C-section delivery at an estimated 39 weeks by US GA due to abruption. Born to a G1P0 mother with no PNC. Bleeding started at 3:30 AM and presented to MAU. At delivery she was found to have a 25% abruption with fetal nuchal cord x 1.  Infant vigorous with good spontaneous cry. Routine NRP followed including warming, drying and stimulation. Apgars 8 / 9. Left in OR for skin-to-skin contact with mother, in care of CN staff. Care transferred to Pediatrician.  Amina Menchaca, DO  Neonatologist 

## 2014-06-11 ENCOUNTER — Emergency Department (HOSPITAL_COMMUNITY)
Admission: EM | Admit: 2014-06-11 | Discharge: 2014-06-11 | Discharge status: Absent without leave | Payer: Medicaid Other

## 2014-06-11 NOTE — ED Notes (Signed)
Patient called x 3 for triage. No answer.  

## 2014-06-11 NOTE — ED Notes (Signed)
Called for triage x2, no answer.

## 2014-12-12 ENCOUNTER — Inpatient Hospital Stay (HOSPITAL_COMMUNITY)
Admission: AD | Admit: 2014-12-12 | Discharge: 2014-12-15 | DRG: 766 | Disposition: A | Discharge status: Home / Self Care | Payer: Medicaid Other | Source: Ambulatory Visit | Attending: Obstetrics & Gynecology | Admitting: Obstetrics & Gynecology

## 2014-12-12 ENCOUNTER — Inpatient Hospital Stay (HOSPITAL_COMMUNITY): Payer: Medicaid Other | Admitting: Anesthesiology

## 2014-12-12 ENCOUNTER — Inpatient Hospital Stay (HOSPITAL_COMMUNITY): Payer: Medicaid Other

## 2014-12-12 ENCOUNTER — Inpatient Hospital Stay (HOSPITAL_COMMUNITY): Payer: Self-pay | Admitting: Anesthesiology

## 2014-12-12 ENCOUNTER — Encounter (HOSPITAL_COMMUNITY): Payer: Self-pay | Admitting: Certified Registered Nurse Anesthetist

## 2014-12-12 ENCOUNTER — Encounter (HOSPITAL_COMMUNITY): Payer: Self-pay | Admitting: *Deleted

## 2014-12-12 ENCOUNTER — Encounter (HOSPITAL_COMMUNITY)
Admission: AD | Disposition: A | Discharge status: Home / Self Care | Payer: Self-pay | Source: Ambulatory Visit | Attending: Obstetrics & Gynecology

## 2014-12-12 DIAGNOSIS — O4593 Premature separation of placenta, unspecified, third trimester: Principal | ICD-10-CM | POA: Diagnosis present

## 2014-12-12 DIAGNOSIS — Z3A39 39 weeks gestation of pregnancy: Secondary | ICD-10-CM

## 2014-12-12 DIAGNOSIS — O0933 Supervision of pregnancy with insufficient antenatal care, third trimester: Secondary | ICD-10-CM

## 2014-12-12 DIAGNOSIS — Z98891 History of uterine scar from previous surgery: Secondary | ICD-10-CM

## 2014-12-12 DIAGNOSIS — F17210 Nicotine dependence, cigarettes, uncomplicated: Secondary | ICD-10-CM | POA: Diagnosis present

## 2014-12-12 DIAGNOSIS — O99334 Smoking (tobacco) complicating childbirth: Secondary | ICD-10-CM | POA: Diagnosis present

## 2014-12-12 LAB — DIFFERENTIAL
BASOS ABS: 0 10*3/uL (ref 0.0–0.1)
BASOS PCT: 0 % (ref 0–1)
Eosinophils Absolute: 0.2 10*3/uL (ref 0.0–0.7)
Eosinophils Relative: 2 % (ref 0–5)
Lymphocytes Relative: 25 % (ref 12–46)
Lymphs Abs: 2.9 10*3/uL (ref 0.7–4.0)
MONO ABS: 1.2 10*3/uL — AB (ref 0.1–1.0)
Monocytes Relative: 10 % (ref 3–12)
NEUTROS ABS: 7.3 10*3/uL (ref 1.7–7.7)
Neutrophils Relative %: 63 % (ref 43–77)

## 2014-12-12 LAB — CBC
HCT: 30 % — ABNORMAL LOW (ref 36.0–46.0)
HEMOGLOBIN: 9.8 g/dL — AB (ref 12.0–15.0)
MCH: 30.3 pg (ref 26.0–34.0)
MCHC: 32.7 g/dL (ref 30.0–36.0)
MCV: 92.9 fL (ref 78.0–100.0)
PLATELETS: 263 10*3/uL (ref 150–400)
RBC: 3.23 MIL/uL — AB (ref 3.87–5.11)
RDW: 14.4 % (ref 11.5–15.5)
WBC: 11.6 10*3/uL — AB (ref 4.0–10.5)

## 2014-12-12 LAB — URINALYSIS, ROUTINE W REFLEX MICROSCOPIC
Bilirubin Urine: NEGATIVE
GLUCOSE, UA: NEGATIVE mg/dL
Ketones, ur: NEGATIVE mg/dL
Leukocytes, UA: NEGATIVE
NITRITE: NEGATIVE
Protein, ur: NEGATIVE mg/dL
Urobilinogen, UA: 1 mg/dL (ref 0.0–1.0)
pH: 6 (ref 5.0–8.0)

## 2014-12-12 LAB — RAPID URINE DRUG SCREEN, HOSP PERFORMED
Amphetamines: NOT DETECTED
Barbiturates: NOT DETECTED
Benzodiazepines: NOT DETECTED
Cocaine: NOT DETECTED
Opiates: NOT DETECTED
Tetrahydrocannabinol: NOT DETECTED

## 2014-12-12 LAB — RUBELLA SCREEN: Rubella: 1.47 index (ref >0.99)

## 2014-12-12 LAB — RAPID HIV SCREEN (HIV 1/2 AB+AG)
HIV 1/2 ANTIBODIES: NONREACTIVE
HIV-1 P24 Antigen - HIV24: NONREACTIVE

## 2014-12-12 LAB — ABO/RH: ABO/RH(D): A POS

## 2014-12-12 LAB — TYPE AND SCREEN
ABO/RH(D): A POS
Antibody Screen: NEGATIVE

## 2014-12-12 LAB — RPR: RPR Ser Ql: NONREACTIVE

## 2014-12-12 LAB — MRSA PCR SCREENING: MRSA by PCR: NEGATIVE

## 2014-12-12 LAB — URINE MICROSCOPIC-ADD ON

## 2014-12-12 SURGERY — Surgical Case
Anesthesia: Spinal

## 2014-12-12 MED ORDER — CITRIC ACID-SODIUM CITRATE 334-500 MG/5ML PO SOLN
ORAL | Status: AC
  Filled 2014-12-12: qty 15

## 2014-12-12 MED ORDER — KETOROLAC TROMETHAMINE 30 MG/ML IJ SOLN
30.0000 mg | Freq: Once | INTRAMUSCULAR | Status: AC
  Administered 2014-12-12: 30 mg via INTRAVENOUS

## 2014-12-12 MED ORDER — SIMETHICONE 80 MG PO CHEW
80.0000 mg | CHEWABLE_TABLET | ORAL | Status: DC
  Administered 2014-12-13 – 2014-12-14 (×3): 80 mg via ORAL
  Filled 2014-12-12 (×3): qty 1

## 2014-12-12 MED ORDER — MENTHOL 3 MG MT LOZG: 1.0000 | LOZENGE | OROMUCOSAL | Status: DC | PRN

## 2014-12-12 MED ORDER — ONDANSETRON HCL 4 MG/2ML IJ SOLN
INTRAMUSCULAR | Status: AC
  Filled 2014-12-12: qty 2

## 2014-12-12 MED ORDER — ACETAMINOPHEN 325 MG PO TABS: 650.0000 mg | ORAL_TABLET | ORAL | Status: DC | PRN

## 2014-12-12 MED ORDER — DIPHENHYDRAMINE HCL 25 MG PO CAPS: 25.0000 mg | ORAL_CAPSULE | Freq: Four times a day (QID) | ORAL | Status: DC | PRN

## 2014-12-12 MED ORDER — SUCCINYLCHOLINE CHLORIDE 20 MG/ML IJ SOLN
INTRAMUSCULAR | Status: AC
  Filled 2014-12-12: qty 1

## 2014-12-12 MED ORDER — FENTANYL CITRATE (PF) 250 MCG/5ML IJ SOLN
INTRAMUSCULAR | Status: AC
  Filled 2014-12-12: qty 5

## 2014-12-12 MED ORDER — PROMETHAZINE HCL 25 MG/ML IJ SOLN
12.5000 mg | Freq: Once | INTRAMUSCULAR | Status: AC
  Administered 2014-12-12: 12.5 mg via INTRAVENOUS
  Filled 2014-12-12: qty 1

## 2014-12-12 MED ORDER — SIMETHICONE 80 MG PO CHEW: 80.0000 mg | CHEWABLE_TABLET | ORAL | Status: DC | PRN

## 2014-12-12 MED ORDER — OXYCODONE-ACETAMINOPHEN 5-325 MG PO TABS
2.0000 | ORAL_TABLET | ORAL | Status: DC | PRN
Start: 2014-12-12 — End: 2014-12-15
  Administered 2014-12-12 (×3): 2 via ORAL
  Filled 2014-12-12 (×3): qty 2

## 2014-12-12 MED ORDER — 0.9 % SODIUM CHLORIDE (POUR BTL) OPTIME
TOPICAL | Status: DC | PRN
  Administered 2014-12-12: 1000 mL

## 2014-12-12 MED ORDER — FENTANYL CITRATE (PF) 100 MCG/2ML IJ SOLN
INTRAMUSCULAR | Status: DC | PRN
  Administered 2014-12-12: 250 ug via INTRAVENOUS

## 2014-12-12 MED ORDER — HYDROMORPHONE HCL 2 MG PO TABS
2.0000 mg | ORAL_TABLET | ORAL | Status: DC | PRN
Start: 2014-12-12 — End: 2014-12-15
  Administered 2014-12-13 (×5): 4 mg via ORAL
  Administered 2014-12-14: 2 mg via ORAL
  Administered 2014-12-14 – 2014-12-15 (×7): 4 mg via ORAL
  Filled 2014-12-12 (×12): qty 2

## 2014-12-12 MED ORDER — ZOLPIDEM TARTRATE 5 MG PO TABS: 5.0000 mg | ORAL_TABLET | Freq: Every evening | ORAL | Status: DC | PRN

## 2014-12-12 MED ORDER — GABAPENTIN 300 MG PO CAPS
300.0000 mg | ORAL_CAPSULE | Freq: Every day | ORAL | Status: DC
  Administered 2014-12-14 – 2014-12-15 (×2): 300 mg via ORAL
  Filled 2014-12-12 (×4): qty 1

## 2014-12-12 MED ORDER — METOCLOPRAMIDE HCL 5 MG/ML IJ SOLN: 10.0000 mg | Freq: Once | INTRAMUSCULAR | Status: DC | PRN

## 2014-12-12 MED ORDER — SIMETHICONE 80 MG PO CHEW
80.0000 mg | CHEWABLE_TABLET | Freq: Three times a day (TID) | ORAL | Status: DC
  Administered 2014-12-12 – 2014-12-15 (×8): 80 mg via ORAL
  Filled 2014-12-12 (×9): qty 1

## 2014-12-12 MED ORDER — DIBUCAINE 1 % RE OINT: 1.0000 "application " | TOPICAL_OINTMENT | RECTAL | Status: DC | PRN

## 2014-12-12 MED ORDER — IBUPROFEN 600 MG PO TABS
600.0000 mg | ORAL_TABLET | Freq: Four times a day (QID) | ORAL | Status: DC
  Administered 2014-12-12 – 2014-12-15 (×11): 600 mg via ORAL
  Filled 2014-12-12 (×11): qty 1

## 2014-12-12 MED ORDER — DEXAMETHASONE SODIUM PHOSPHATE 10 MG/ML IJ SOLN
INTRAMUSCULAR | Status: DC | PRN
  Administered 2014-12-12: 4 mg via INTRAVENOUS

## 2014-12-12 MED ORDER — OXYTOCIN 40 UNITS IN LACTATED RINGERS INFUSION - SIMPLE MED: 62.5000 mL/h | INTRAVENOUS | Status: AC

## 2014-12-12 MED ORDER — OXYCODONE-ACETAMINOPHEN 5-325 MG PO TABS: 1.0000 | ORAL_TABLET | ORAL | Status: DC | PRN

## 2014-12-12 MED ORDER — CITRIC ACID-SODIUM CITRATE 334-500 MG/5ML PO SOLN
ORAL | Status: DC | PRN
Start: 2014-12-12 — End: 2014-12-12
  Administered 2014-12-12: 30 mL via ORAL

## 2014-12-12 MED ORDER — PRENATAL MULTIVITAMIN CH
1.0000 | ORAL_TABLET | Freq: Every day | ORAL | Status: DC
  Administered 2014-12-12 – 2014-12-14 (×3): 1 via ORAL
  Filled 2014-12-12 (×3): qty 1

## 2014-12-12 MED ORDER — LACTATED RINGERS IV SOLN
INTRAVENOUS | Status: DC
  Administered 2014-12-12 – 2014-12-13 (×2): via INTRAVENOUS

## 2014-12-12 MED ORDER — SENNOSIDES-DOCUSATE SODIUM 8.6-50 MG PO TABS
2.0000 | ORAL_TABLET | ORAL | Status: DC
Start: 2014-12-13 — End: 2014-12-15
  Administered 2014-12-13 – 2014-12-14 (×3): 2 via ORAL
  Filled 2014-12-12 (×3): qty 2

## 2014-12-12 MED ORDER — TETANUS-DIPHTH-ACELL PERTUSSIS 5-2.5-18.5 LF-MCG/0.5 IM SUSP: 0.5000 mL | Freq: Once | INTRAMUSCULAR | Status: DC

## 2014-12-12 MED ORDER — SUCCINYLCHOLINE CHLORIDE 20 MG/ML IJ SOLN
INTRAMUSCULAR | Status: DC | PRN
  Administered 2014-12-12: 120 mg via INTRAVENOUS

## 2014-12-12 MED ORDER — OXYTOCIN 10 UNIT/ML IJ SOLN
INTRAMUSCULAR | Status: AC
Start: 2014-12-12 — End: 2014-12-12
  Filled 2014-12-12: qty 4

## 2014-12-12 MED ORDER — MIDAZOLAM HCL 2 MG/2ML IJ SOLN
INTRAMUSCULAR | Status: DC | PRN
  Administered 2014-12-12: 2 mg via INTRAVENOUS

## 2014-12-12 MED ORDER — HYDROMORPHONE HCL 1 MG/ML IJ SOLN
INTRAMUSCULAR | Status: AC
  Administered 2014-12-12: 0.5 mg via INTRAVENOUS
  Filled 2014-12-12: qty 1

## 2014-12-12 MED ORDER — METHYLERGONOVINE MALEATE 0.2 MG/ML IJ SOLN: 0.2000 mg | INTRAMUSCULAR | Status: DC | PRN

## 2014-12-12 MED ORDER — HYDROMORPHONE HCL 1 MG/ML IJ SOLN
INTRAMUSCULAR | Status: AC
  Filled 2014-12-12: qty 1

## 2014-12-12 MED ORDER — WITCH HAZEL-GLYCERIN EX PADS: 1.0000 "application " | MEDICATED_PAD | CUTANEOUS | Status: DC | PRN

## 2014-12-12 MED ORDER — LACTATED RINGERS IV SOLN
INTRAVENOUS | Status: DC | PRN
  Administered 2014-12-12: 10:00:00 via INTRAVENOUS

## 2014-12-12 MED ORDER — PNEUMOCOCCAL VAC POLYVALENT 25 MCG/0.5ML IJ INJ
0.5000 mL | INJECTION | INTRAMUSCULAR | Status: DC
  Filled 2014-12-12: qty 0.5

## 2014-12-12 MED ORDER — MIDAZOLAM HCL 2 MG/2ML IJ SOLN
INTRAMUSCULAR | Status: AC
  Filled 2014-12-12: qty 2

## 2014-12-12 MED ORDER — ONDANSETRON HCL 4 MG/2ML IJ SOLN
INTRAMUSCULAR | Status: DC | PRN
  Administered 2014-12-12: 4 mg via INTRAVENOUS

## 2014-12-12 MED ORDER — CEFAZOLIN SODIUM-DEXTROSE 2-3 GM-% IV SOLR
INTRAVENOUS | Status: DC | PRN
  Administered 2014-12-12: 2 g via INTRAVENOUS

## 2014-12-12 MED ORDER — PROPOFOL 10 MG/ML IV BOLUS
INTRAVENOUS | Status: AC
Start: 2014-12-12 — End: 2014-12-12
  Filled 2014-12-12: qty 20

## 2014-12-12 MED ORDER — DEXAMETHASONE SODIUM PHOSPHATE 10 MG/ML IJ SOLN
INTRAMUSCULAR | Status: AC
  Filled 2014-12-12: qty 1

## 2014-12-12 MED ORDER — HYDROMORPHONE HCL 1 MG/ML IJ SOLN
0.2500 mg | INTRAMUSCULAR | Status: DC | PRN
  Administered 2014-12-12: 0.5 mg via INTRAVENOUS
  Administered 2014-12-12: 0.25 mg via INTRAVENOUS
  Administered 2014-12-12 (×3): 0.5 mg via INTRAVENOUS

## 2014-12-12 MED ORDER — KETOROLAC TROMETHAMINE 30 MG/ML IJ SOLN
INTRAMUSCULAR | Status: AC
  Filled 2014-12-12: qty 1

## 2014-12-12 MED ORDER — METHYLERGONOVINE MALEATE 0.2 MG PO TABS: 0.2000 mg | ORAL_TABLET | ORAL | Status: DC | PRN

## 2014-12-12 MED ORDER — NALBUPHINE HCL 10 MG/ML IJ SOLN
10.0000 mg | Freq: Once | INTRAMUSCULAR | Status: AC
  Administered 2014-12-12: 10 mg via INTRAVENOUS
  Filled 2014-12-12: qty 1

## 2014-12-12 MED ORDER — LANOLIN HYDROUS EX OINT: 1.0000 "application " | TOPICAL_OINTMENT | CUTANEOUS | Status: DC | PRN

## 2014-12-12 MED ORDER — MEPERIDINE HCL 25 MG/ML IJ SOLN: 6.2500 mg | INTRAMUSCULAR | Status: DC | PRN

## 2014-12-12 MED ORDER — PROPOFOL 10 MG/ML IV BOLUS
INTRAVENOUS | Status: DC | PRN
  Administered 2014-12-12: 180 mg via INTRAVENOUS

## 2014-12-12 SURGICAL SUPPLY — 36 items
CLAMP CORD UMBIL (MISCELLANEOUS) IMPLANT
CLOTH BEACON ORANGE TIMEOUT ST (SAFETY) ×3 IMPLANT
DRAPE SHEET LG 3/4 BI-LAMINATE (DRAPES) IMPLANT
DRSG OPSITE POSTOP 4X10 (GAUZE/BANDAGES/DRESSINGS) ×3 IMPLANT
DURAPREP 26ML APPLICATOR (WOUND CARE) ×6 IMPLANT
ELECT REM PT RETURN 9FT ADLT (ELECTROSURGICAL) ×3
ELECTRODE REM PT RTRN 9FT ADLT (ELECTROSURGICAL) ×1 IMPLANT
EXTRACTOR VACUUM BELL STYLE (SUCTIONS) IMPLANT
GLOVE BIOGEL PI IND STRL 8 (GLOVE) ×1 IMPLANT
GLOVE BIOGEL PI INDICATOR 8 (GLOVE) ×2
GLOVE ECLIPSE 8.0 STRL XLNG CF (GLOVE) ×3 IMPLANT
GOWN STRL REUS W/TWL LRG LVL3 (GOWN DISPOSABLE) ×6 IMPLANT
KIT ABG SYR 3ML LUER SLIP (SYRINGE) ×3 IMPLANT
LIQUID BAND (GAUZE/BANDAGES/DRESSINGS) ×6 IMPLANT
NDL HYPO 18GX1.5 BLUNT FILL (NEEDLE) ×1 IMPLANT
NDL HYPO 25X5/8 SAFETYGLIDE (NEEDLE) ×1 IMPLANT
NEEDLE HYPO 18GX1.5 BLUNT FILL (NEEDLE) ×3 IMPLANT
NEEDLE HYPO 22GX1.5 SAFETY (NEEDLE) ×3 IMPLANT
NEEDLE HYPO 25X5/8 SAFETYGLIDE (NEEDLE) ×3 IMPLANT
NS IRRIG 1000ML POUR BTL (IV SOLUTION) ×5 IMPLANT
PACK C SECTION WH (CUSTOM PROCEDURE TRAY) ×3 IMPLANT
PAD OB MATERNITY 4.3X12.25 (PERSONAL CARE ITEMS) ×3 IMPLANT
RTRCTR C-SECT PINK 25CM LRG (MISCELLANEOUS) IMPLANT
STAPLER VISISTAT 35W (STAPLE) ×2 IMPLANT
SUT CHROMIC 0 CT 1 (SUTURE) ×3 IMPLANT
SUT MNCRL 0 VIOLET CTX 36 (SUTURE) ×2 IMPLANT
SUT MONOCRYL 0 CTX 36 (SUTURE) ×4
SUT PLAIN 2 0 (SUTURE)
SUT PLAIN 2 0 XLH (SUTURE) IMPLANT
SUT PLAIN ABS 2-0 CT1 27XMFL (SUTURE) IMPLANT
SUT VIC AB 0 CTX 36 (SUTURE) ×3
SUT VIC AB 0 CTX36XBRD ANBCTRL (SUTURE) ×1 IMPLANT
SUT VIC AB 4-0 KS 27 (SUTURE) IMPLANT
SYR 20CC LL (SYRINGE) ×6 IMPLANT
TOWEL OR 17X24 6PK STRL BLUE (TOWEL DISPOSABLE) ×3 IMPLANT
TRAY FOLEY CATH SILVER 14FR (SET/KITS/TRAYS/PACK) IMPLANT

## 2014-12-12 NOTE — Anesthesia Postprocedure Evaluation (Signed)
  Anesthesia Post-op Note  Patient: Amber Dillon  Procedure(s) Performed: Procedure(s): CESAREAN SECTION (N/A)  Patient Location: Mother/Baby  Anesthesia Type:General  Level of Consciousness: awake  Airway and Oxygen Therapy: Patient Spontanous Breathing  Post-op Pain: mild  Post-op Assessment: Post-op Vital signs reviewed and Patient's Cardiovascular Status Stable              Post-op Vital Signs: stable  Last Vitals:  Filed Vitals:   12/12/14 1437  BP: 110/54  Pulse: 77  Temp: 36.7 C  Resp: 18    Complications: No apparent anesthesia complications

## 2014-12-12 NOTE — MAU Note (Signed)
Pt presents to MAU with complaints of contractions that started around 330 this morning with some bloody show. Reports she has had a couple of visits at the Health [facility] but has not had an ultrasound this pregnancy. States her last menstrual cycle was October the 7th.

## 2014-12-12 NOTE — Addendum Note (Signed)
Addendum  created 12/12/14 1457 by Renford DillsJanet L Katye Valek, CRNA   Modules edited: Notes Section   Notes Section:  File: [id number]

## 2014-12-12 NOTE — H&P (Signed)
Amber ArenasBrittany N Meldrum is a 29 y.o. female G2P1001 presenting for vag bleeding and abd pain that started at 0330 this a.m. No PNC. Maternal Medical History:  Reason for admission: Contractions.  Presents with abd pain and vag bleeding.    OB History    Gravida Para Term Preterm AB TAB SAB Ectopic Multiple Living   1              No past medical history on file. No past surgical history on file. Family History: family history is not on file. Social History:  reports that she has been smoking.  She does not have any smokeless tobacco history on file. She reports that she does not drink alcohol or use illicit drugs.   Prenatal Transfer Tool  Maternal Diabetes: No Genetic Screening: Declined Maternal Ultrasounds/Referrals: Declined Fetal Ultrasounds or other Referrals:  None Maternal Substance Abuse:  No Significant Maternal Medications:  None Significant Maternal Lab Results:  None Other Comments:  None  Review of Systems  Constitutional: Negative.   HENT: Negative.   Eyes: Negative.   Respiratory: Negative.   Cardiovascular: Negative.   Gastrointestinal: Positive for abdominal pain.  Genitourinary:       Sm amt bright vag bleeding  Skin: Negative.   Neurological: Negative.   Endo/Heme/Allergies: Negative.   Psychiatric/Behavioral: Negative.       Last menstrual period 03/10/2014. Maternal Exam:  Uterine Assessment: Contraction strength is moderate.  Contraction frequency is regular.   Abdomen: Patient reports generalized tenderness.  Fundal height is 34.   Estimated fetal weight is 5lbs.   Fetal presentation: vertex  Introitus: Normal vulva. Normal vagina.  Ferning test: not done.  Nitrazine test: not done. Amniotic fluid character: not assessed.  Pelvis: adequate for delivery.   Cervix: Cervix evaluated by digital exam.     Fetal Exam Fetal Monitor Review: Mode: ultrasound.   Variability: moderate (6-25 bpm).   Pattern: variable decelerations.    Fetal State  Assessment: Category II - tracings are indeterminate.     Physical Exam  Constitutional: She is oriented to person, place, and time. She appears well-developed and well-nourished.  HENT:  Head: Normocephalic.  Eyes: Pupils are equal, round, and reactive to light.  Neck: Normal range of motion.  Cardiovascular: Normal rate, regular rhythm, normal heart sounds and intact distal pulses.   Respiratory: Effort normal and breath sounds normal.  GI: Soft. Bowel sounds are normal. There is generalized tenderness.  Genitourinary: Vagina normal and uterus normal.  Musculoskeletal: Normal range of motion.  Neurological: She is alert and oriented to person, place, and time. She has normal reflexes.  Skin: Skin is warm and dry.  Psychiatric: She has a normal mood and affect. Her behavior is normal. Judgment and thought content normal.    Prenatal labs: ABO, Rh:   Antibody:   Rubella:   RPR:    HBsAg:    HIV:    GBS:     Assessment/Plan: No PNC, vag bleeding in pregnancy and abd pain. SVE ft/firm/post -2 P: Dr. Despina HiddenEure consulted and orders given. Will start IV, get labs and UDS and close obs.   LAWSON, MARIE DARLENE 12/12/2014, 9:10 AM

## 2014-12-12 NOTE — Progress Notes (Signed)
Notified pt bleeding, no pnc, will come see pt.

## 2014-12-12 NOTE — Anesthesia Preprocedure Evaluation (Addendum)
Anesthesia Evaluation  Patient identified by MRN, date of birth, ID band Patient awake    Reviewed: Allergy & Precautions, H&P , NPO status , Patient's Chart, lab work & pertinent test results  Airway Mallampati: II  TM Distance: >3 FB Neck ROM: Full    Dental no notable dental hx. (+) Teeth Intact   Pulmonary neg pulmonary ROS, Current Smoker,  breath sounds clear to auscultation  Pulmonary exam normal       Cardiovascular negative cardio ROS Normal cardiovascular examRhythm:Regular Rate:Normal     Neuro/Psych negative neurological ROS  negative psych ROS   GI/Hepatic negative GI ROS, Neg liver ROS,   Endo/Other  negative endocrine ROS  Renal/GU negative Renal ROS     Musculoskeletal   Abdominal   Peds  Hematology negative hematology ROS (+)   Anesthesia Other Findings   Reproductive/Obstetrics (+) Pregnancy                            Anesthesia Physical Anesthesia Plan  ASA: II and emergent  Anesthesia Plan: Spinal   Post-op Pain Management:    Induction:   Airway Management Planned:   Additional Equipment:   Intra-op Plan:   Post-operative Plan:   Informed Consent: I have reviewed the patients History and Physical, chart, labs and discussed the procedure including the risks, benefits and alternatives for the proposed anesthesia with the patient or authorized representative who has indicated his/her understanding and acceptance.   Dental Advisory Given  Plan Discussed with: Anesthesiologist, CRNA and Surgeon  Anesthesia Plan Comments:         Anesthesia Quick Evaluation

## 2014-12-12 NOTE — Op Note (Signed)
Preoperative diagnosis:  1.  Intrauterine pregnancy at 1966w4d  weeks gestation                                         2.  Placental abruption with repetitive fetal heart rate declerations, variables vs late stage late decelerations                                         3.  NO PNC   Postoperative diagnosis:  Same as above plus 25% or so abruption with fetal nuchal cord x 1  Procedure:  Caesarean section  Surgeon:  Lazaro ArmsLuther H Samanvitha Germany MD  Assistant:    Anesthesia: general  Findings:  . Pt presented to the MAU with complaint of bleeding and severe abdominal pain, started about 0330 this am.  On presentation FHR strip was significant for prolonged variables vs late stage lates, HR to the 70s with no variability.  Sonogram with EGS 37 weeks no sonographic evidence of abruption was seen.  I rechecked cervix 2 cm, thick ROM and significant blood.  I made the decision to do a STAT Caesareans section.   Over a low transverse incision was delivered a viable female with Apgars of pending and pending weighing pending lbs. pending oz.  There was a large amount of blood in the amniotic cavity and there was a nuchal cord x 1, tight. The placenta was examined and i estimated a 25% abruption. Uterus, tubes and ovaries were all normal.  There were no other significant findings  Description of operation:  Patient was taken to the operating room emergently where she underwent a general anesthetic.  prior to induction of anesthesia she was prepped and draped in usual sterile fashion and a Foley catheter was placed. A Pfannenstiel skin incision was made and carried down sharply to the rectus fascia which was scored in the midline extended laterally. The fascia was taken off the muscles both superiorly and without difficulty. The muscles were divided.  The peritoneal cavity was entered.  Bladder blade was placed, no bladder flap was created.  A low transverse hysterotomy incision was made and delivered a viable female   infant at [id number] with Apgars of pending and pending weighingpending lbs pending oz.  Cord pH was obtained and was pending. The uterus was exteriorized. It was closed in 2 layers, the first being a running interlocking layer and the second being an imbricating layer using 0 monocryl on a CTX needle. There was good resulting hemostasis. The uterus tubes and ovaries were all normal. Peritoneal cavity was irrigated vigorously. The muscles and peritoneum were reapproximated loosely. The fascia was closed using 0 Vicryl in running fashion. Subcutaneous tissue was made hemostatic and irrigated. The skin was closed using skin staples due to my concern of possible post abruption coagulopathy.  Blood loss for the procedure was 500 cc. The patient received a gram of Ancef prophylactically. The patient was taken to the recovery room in good stable condition with all counts being correct x3.  EBL 500 cc  Rajinder Mesick H 12/12/2014 10:16 AM

## 2014-12-12 NOTE — Anesthesia Postprocedure Evaluation (Signed)
  Anesthesia Post-op Note  Patient: Amber ArenasBrittany N Safi  Procedure(s) Performed: Procedure(s): CESAREAN SECTION (N/A)  Patient Location: PACU  Anesthesia Type:General  Level of Consciousness: awake, alert  and oriented  Airway and Oxygen Therapy: Patient Spontanous Breathing and Patient connected to nasal cannula oxygen  Post-op Pain: mild  Post-op Assessment: Post-op Vital signs reviewed, Patient's Cardiovascular Status Stable, Respiratory Function Stable, Patent Airway, No signs of Nausea or vomiting and Pain level controlled              Post-op Vital Signs: Reviewed and stable  Last Vitals:  Filed Vitals:   12/12/14 1145  BP: 102/70  Pulse: 75  Temp: 36.7 C  Resp: 16    Complications: No apparent anesthesia complications

## 2014-12-12 NOTE — Transfer of Care (Signed)
Immediate Anesthesia Transfer of Care Note  Patient: Amber Dillon  Procedure(s) Performed: Procedure(s): CESAREAN SECTION (N/A)  Patient Location: PACU  Anesthesia Type:General  Level of Consciousness: awake, alert  and oriented  Airway & Oxygen Therapy: Patient Spontanous Breathing and Patient connected to nasal cannula oxygen  Post-op Assessment: Report given to RN, Post -op Vital signs reviewed and stable and Patient moving all extremities  Post vital signs: Reviewed and stable  Last Vitals:  Filed Vitals:   12/12/14 0906  BP: 108/71  Pulse: 70  Resp: 18    Complications: No apparent anesthesia complications

## 2014-12-12 NOTE — Anesthesia Procedure Notes (Signed)
Procedure Name: Intubation Date/Time: 12/12/2014 9:54 AM Performed by: Elgie CongoMALINOVA, Mariella Blackwelder H Pre-anesthesia Checklist: Patient identified, Emergency Drugs available, Suction available and Patient being monitored Patient Re-evaluated:Patient Re-evaluated prior to inductionOxygen Delivery Method: Circle system utilized Preoxygenation: Pre-oxygenation with 100% oxygen Intubation Type: IV induction Laryngoscope Size: Glidescope Grade View: Grade I Tube type: Oral Tube size: 7.0 mm Number of attempts: 1 Airway Equipment and Method: Stylet Placement Confirmation: ETT inserted through vocal cords under direct vision,  positive ETCO2 and breath sounds checked- equal and bilateral Secured at: 21 cm Tube secured with: Tape Dental Injury: Teeth and Oropharynx as per pre-operative assessment

## 2014-12-13 ENCOUNTER — Encounter (HOSPITAL_COMMUNITY): Payer: Self-pay | Admitting: Obstetrics & Gynecology

## 2014-12-13 LAB — HEPATITIS B SURFACE ANTIGEN: HEP B S AG: NEGATIVE

## 2014-12-13 LAB — CBC
HEMATOCRIT: 24.1 % — AB (ref 36.0–46.0)
Hemoglobin: 7.9 g/dL — ABNORMAL LOW (ref 12.0–15.0)
MCH: 31 pg (ref 26.0–34.0)
MCHC: 32.8 g/dL (ref 30.0–36.0)
MCV: 94.5 fL (ref 78.0–100.0)
PLATELETS: 272 10*3/uL (ref 150–400)
RBC: 2.55 MIL/uL — ABNORMAL LOW (ref 3.87–5.11)
RDW: 14.2 % (ref 11.5–15.5)
WBC: 15.2 10*3/uL — AB (ref 4.0–10.5)

## 2014-12-13 NOTE — Progress Notes (Signed)
Subjective: Postpartum Day 1: Cesarean Delivery Patient reports incisional pain, tolerating PO and no problems voiding.    Objective: Vital signs in last 24 hours: Temp:  [97.6 F (36.4 C)-98.2 F (36.8 C)] 98.2 F (36.8 C) (07/10 1945) Pulse Rate:  [66-97] 85 (07/10 1945) Resp:  [15-20] 20 (07/10 1945) BP: (102-112)/(54-72) 104/58 mmHg (07/10 1945) SpO2:  [97 %-100 %] 98 % (07/10 1945) Weight:  [160 lb (72.576 kg)] 160 lb (72.576 kg) (07/10 0906)  Physical Exam:  General: alert, cooperative, appears stated age and no distress Lochia: appropriate Uterine Fundus: firm Incision: healing well, no significant drainage, no dehiscence, no significant erythema DVT Evaluation: No evidence of DVT seen on physical exam. Negative Homan's sign. No cords or calf tenderness.   Recent Labs  12/12/14 0912 12/13/14 0543  HGB 9.8* 7.9*  HCT 30.0* 24.1*    Assessment/Plan: Status post Cesarean section. Doing well postoperatively.  Continue current care.  Amber Dillon, Amber Dillon 12/13/2014, 7:18 AM

## 2014-12-13 NOTE — Progress Notes (Signed)
Patient voiding small amounts. Bladder Scan done at 0700 with result of 55 ml scanned. Marlynn Perking Lawson CNM and resident given report of patients progress regarding intake and low output. Patient encouraged to drink fluids and IV Lactated Ringers to continue at 125 ml/hr.

## 2014-12-14 ENCOUNTER — Encounter (HOSPITAL_COMMUNITY): Payer: Self-pay | Admitting: Obstetrics & Gynecology
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
## 2014-12-15 MED ORDER — IBUPROFEN 600 MG PO TABS
600.0000 mg | ORAL_TABLET | Freq: Four times a day (QID) | ORAL | Status: DC
Start: 2014-12-15 — End: 2018-11-19

## 2014-12-15 MED ORDER — HYDROMORPHONE HCL 2 MG PO TABS: 2.0000 mg | ORAL_TABLET | ORAL | Status: DC | PRN

## 2014-12-15 NOTE — Progress Notes (Signed)
Dr. Alvester MorinNewton notified that there were no orders to remove staples prior to DC. She stated she will put in an order for the baby love nurse to remove staple for the patient at home. No order at this time yet.

## 2014-12-15 NOTE — Discharge Summary (Signed)
Obstetric Discharge Summary Reason for Admission: onset of labor Prenatal Procedures: NST Intrapartum Procedures: cesarean: low cervical, transverse Postpartum Procedures: none Complications-Operative and Postpartum: Placental Abruption HEMOGLOBIN  Date Value Ref Range Status  12/13/2014 7.9* 12.0 - 15.0 g/dL Final   HGB  Date Value Ref Range Status  05/29/2014 11.9* 12.0-16.0 g/dL Final   HCT  Date Value Ref Range Status  12/13/2014 24.1* 36.0 - 46.0 % Final  05/29/2014 35.6 35.0-47.0 % Final  Hospital Course:  Expand All Collapse All   Amber Dillon is a 29 y.o. female G2P1001 presenting for vag bleeding and abd pain that started at 0330 this a.m. No PNC. Maternal Medical History:  Reason for admission: Contractions.  Presents with abd pain and vag bleeding.       Expand All Collapse All   Preoperative diagnosis: 1. Intrauterine pregnancy at 4824w4d weeks gestation  2. Placental abruption with repetitive fetal heart rate declerations, variables vs late stage late decelerations  3. NO PNC   Postoperative diagnosis: Same as above plus 25% or so abruption with fetal nuchal cord x 1  Procedure: Caesarean section  Surgeon: Lazaro ArmsLuther H Eure MD  Assistant:   Anesthesia: general  Findings: . Pt presented to the MAU with complaint of bleeding and severe abdominal pain, started about 0330 this am. On presentation FHR strip was significant for prolonged variables vs late stage lates, HR to the 70s with no variability. Sonogram with EGS 37 weeks no sonographic evidence of abruption was seen. I rechecked cervix 2 cm, thick ROM and significant blood. I made the decision to do a STAT Caesareans section.  Over a low transverse incision was delivered a viable female with Apgars of pending and pending weighing pending lbs. pending oz. There was a large amount of blood in the amniotic cavity and  there was a nuchal cord x 1, tight. The placenta was examined and i estimated a 25% abruption. Uterus, tubes and ovaries were all normal. There were no other significant findings     She has done well postoperatively.  She has required Dilaudid for pain due to tolerance of Percocet. UDS was negative. Has decided to Bottle feed. Was seen by Social Work yesterday and evaluations for CPS referral are ongoing. She has had a bowel movement and is tolerating POs. She is deemed ready for discharge.   Physical Exam:  General: alert, cooperative and no distress Lochia: appropriate Uterine Fundus: firm Incision: healing well, no significant drainage, no dehiscence DVT Evaluation: No evidence of DVT seen on physical exam.  Discharge Diagnoses: Term Pregnancy-delivered and Placental Abruption, No prenatal care, History of Substance Abuse  Discharge Information: Date: 12/15/2014 Activity: unrestricted and pelvic rest Diet: routine Medications: PNV, Ibuprofen and Dilaudid 2-4mg  q4 hrs PRN #30, no refills Condition: stable and improved Instructions: refer to practice specific booklet Discharge to: home Follow-up Information    [facility] Schedule an appointment as soon as possible for a visit in 4 weeks.   Contact information:   [address] [place] [id number] [phone]      Newborn Data: Live born female  Birth Weight: 6 lb 3.5 oz (2821 g) APGAR: 8, 9  Home with mother and Depending on Peds assessment and Social work input.  [facility]ILLIAMS,Makyle Eslick 12/15/2014, 7:40 AM

## 2014-12-15 NOTE — Progress Notes (Signed)
Baby love Nurse called and left a messsage for staple removal.

## 2014-12-15 NOTE — Plan of Care (Signed)
Problem: Discharge Progression Outcomes Goal: Remove staples per MD order Outcome: Not Applicable Date Met:  45/85/92 Baby love nurse will remove staples in 7 days.

## 2014-12-15 NOTE — Discharge Instructions (Signed)

## 2015-09-18 IMAGING — US US OB COMP +14 WK
1 series · 13 of 26 positions shown · non-contrast
Comparison: none

[Series 1: us ob comp +14 wk · 26 acquisitions, 13 frames shown]
[im 2/26]
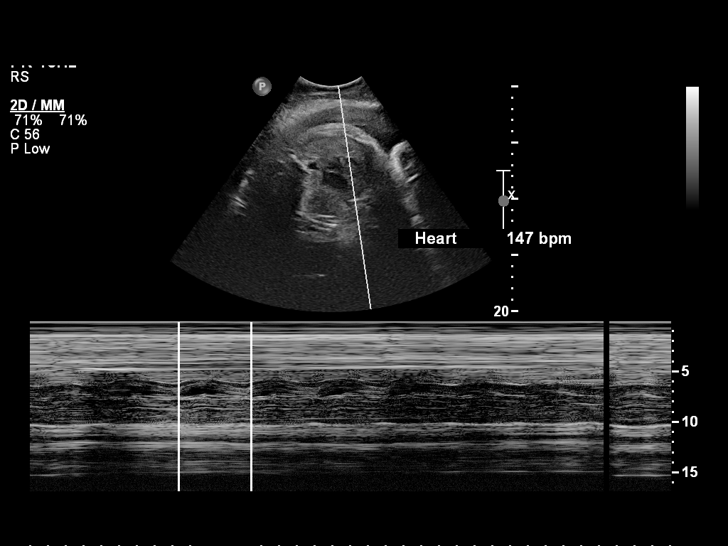
[im 4/26]
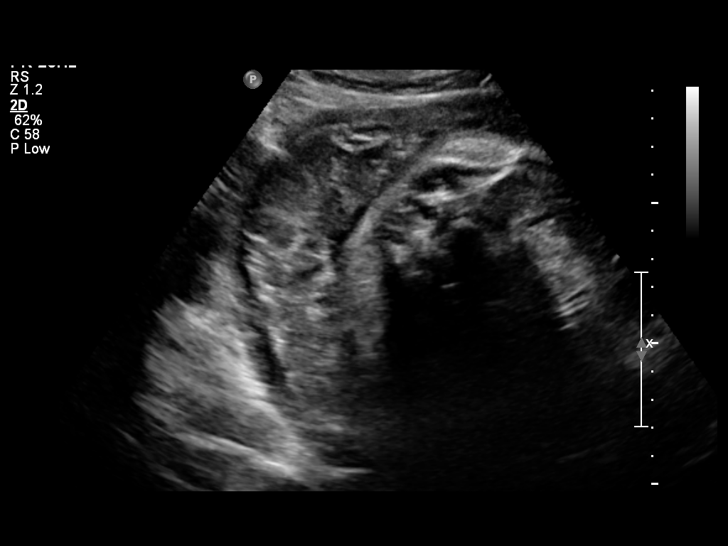
[im 6/26]
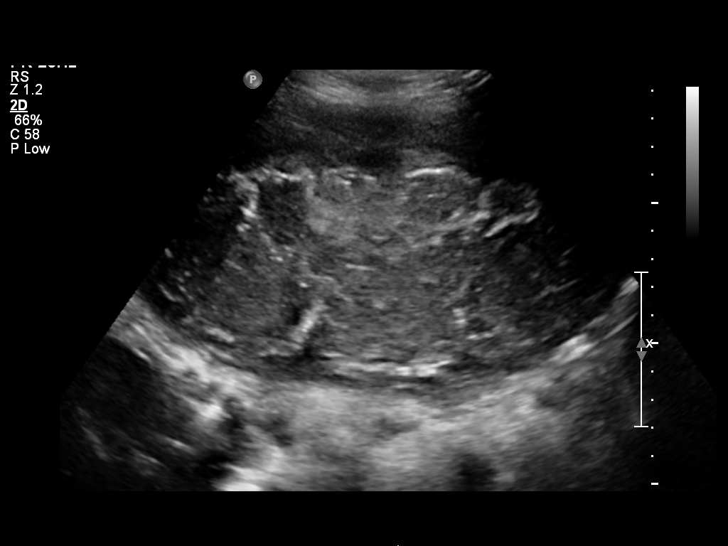
[im 8/26]
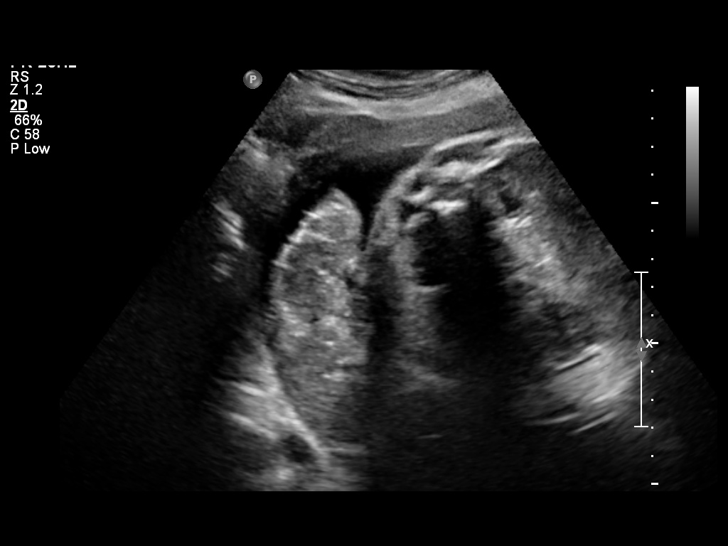
[im 10/26]
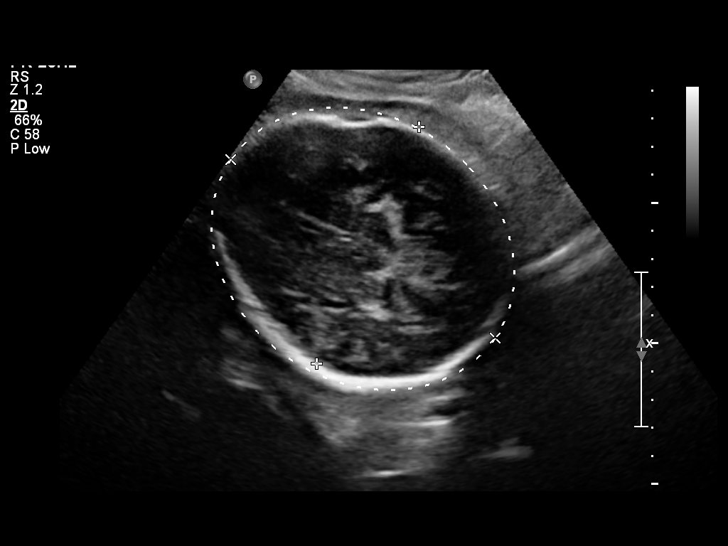
[im 12/26]
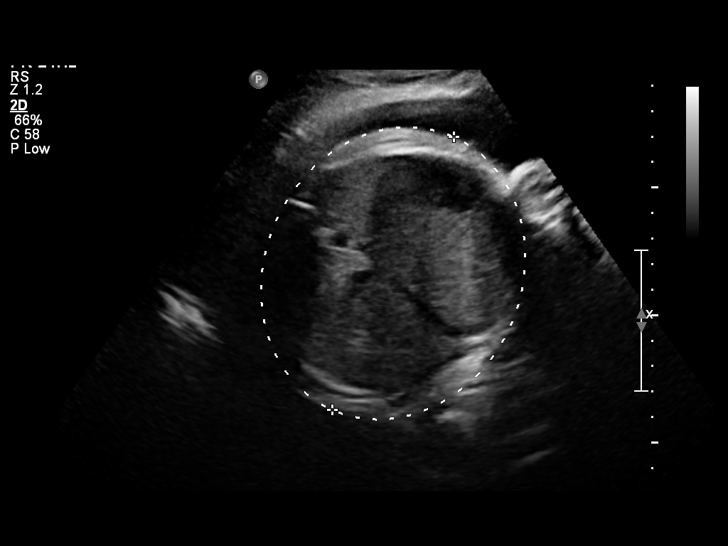
[im 14/26]
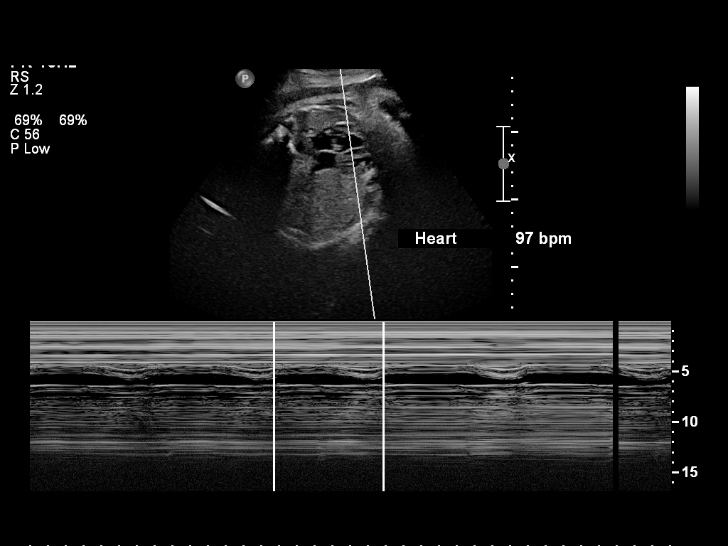
[im 16/26]
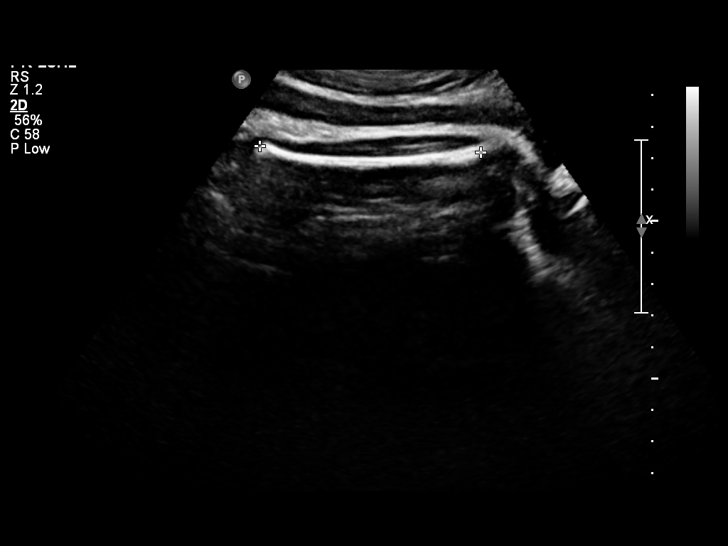
[im 18/26]
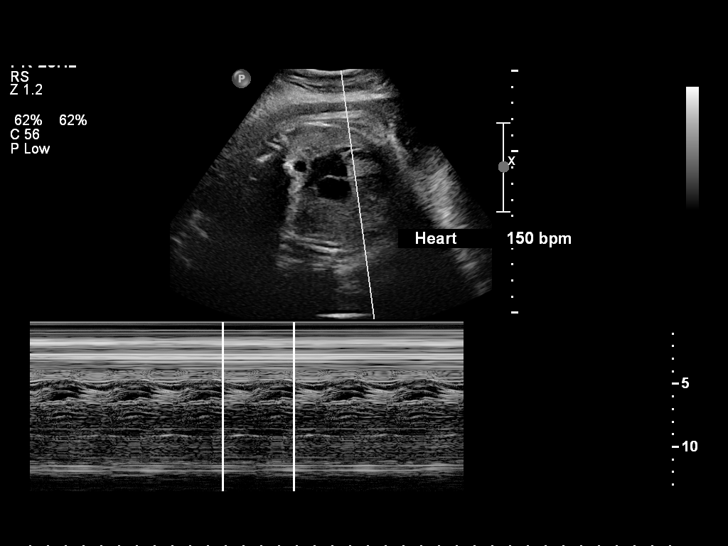
[im 20/26]
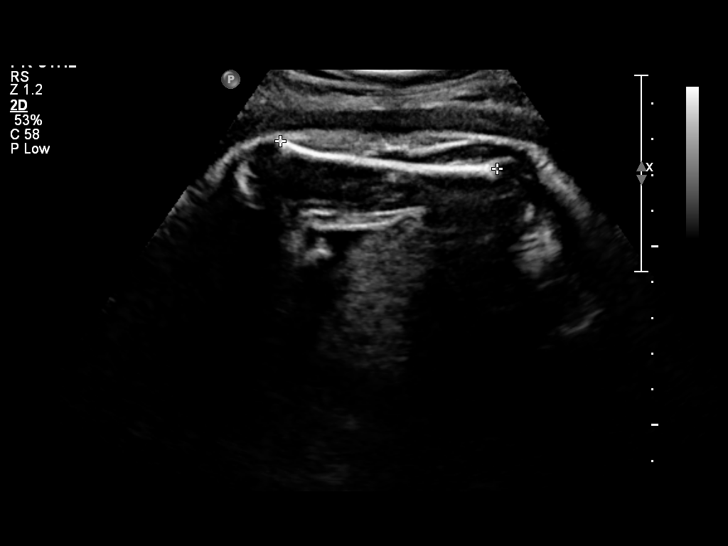
[im 22/26]
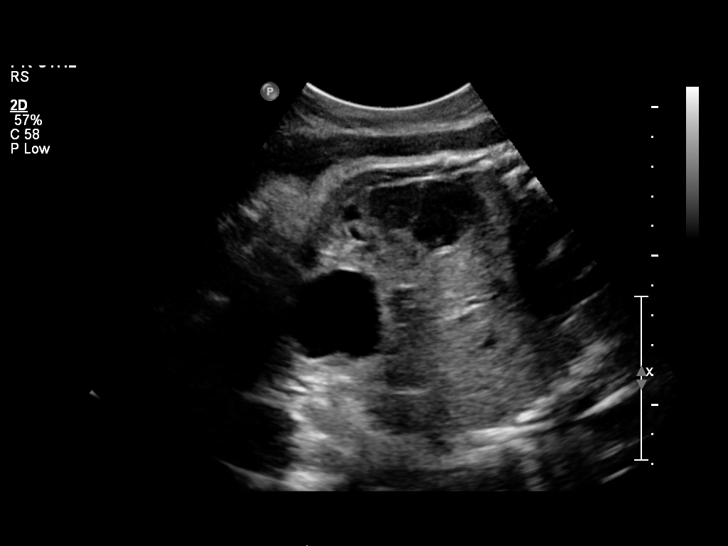
[im 24/26]
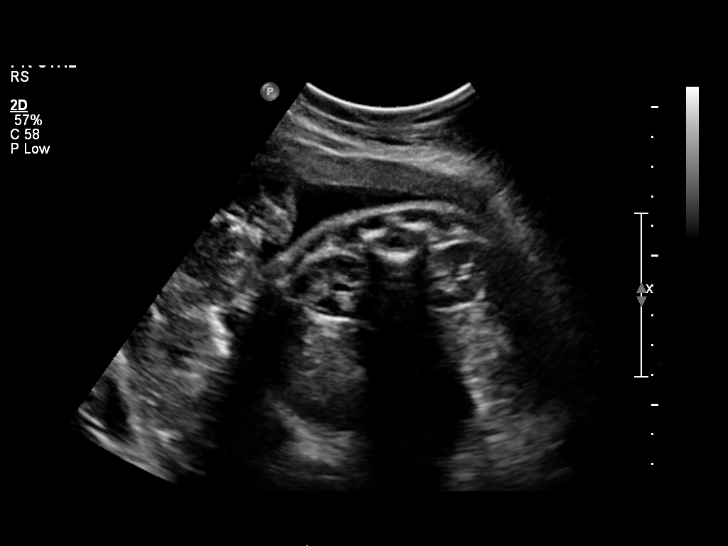
[im 26/26]
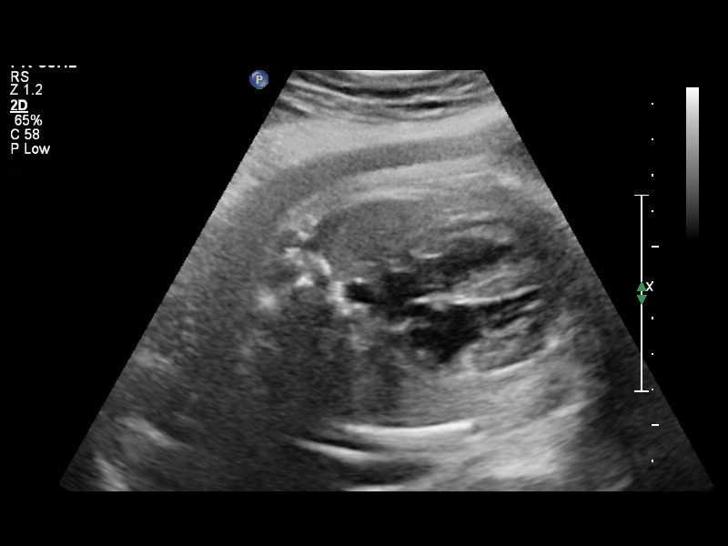

[13 of 26 positions shown; findings below may reference images not displayed]

OBSTETRICS REPORT
(Signed Final 12/12/2014 [DATE])

Service(s) Provided

US OB COMP + 14 WK                                    76805.1
Indications

No Prenatal Care
Vaginal bleeding, unknown etiology
Non-reactive NST, FHR decelerations
Abdominal pain in pregnancy
Assess fetal growth
Cigarette smoker
Uncertain LMP  Establish Gestational Age              Z36
Fetal Evaluation

Num Of Fetuses:    1
Fetal Heart Rate:  150                          bpm
Cardiac Activity:  Observed
Presentation:      Cephalic
Placenta:          Posterior
P. Cord            Not well visualized
Insertion:

Comment:    Fetal decel noted with contraction (77 bpm) Spontaneous
recovery to 150 bpm.

Amniotic Fluid
AFI FV:      Subjectively within normal limits
Biometry

BPD:     91.3  mm     G. Age:  37w 0d                CI:        78.22   70 - 86
FL/HC:      21.6   20.8 -
22.6
HC:     326.6  mm     G. Age:  37w 0d       23   %   HC/AC:      0.95   0.92 -
1.05
AC:     343.6  mm     G. Age:  38w 2d       88   %   FL/BPD:     77.1   71 - 87
FL:      70.4  mm     G. Age:  36w 1d       24   %   FL/AC:      20.5   20 - 24
HUM:       61  mm     G. Age:  35w 2d       37   %

Est. FW:    1999   gm     7 lb 2 oz     77  %
Gestational Age

LMP:           39w 4d        Date:  03/10/14                 EDD:   12/15/14
U/S Today:     37w 1d                                        EDD:   01/01/15
Best:          37w 1d     Det. By:  U/S (12/12/14)           EDD:   01/01/15
Anatomy

Stomach:          Appears normal, left   Bladder:          Appears normal
sided
Kidneys:          Appear normal
Cervix Uterus Adnexa

Cervix:       Not visualized (advanced GA >39wks)
Impression

Limited examination secondary to maternal and fetal condition
Singleton intrauterine pregnancy at 37 weeks 1 day gestation
with fetal cardiac activity
Normal appearing fetal growth and amniotic fluid
Fetal heart rate decelerations
Dr. Ossie aware and will deliver her
Recommendations

Delivery imminent after the ultrasound

questions or concerns.

## 2018-11-19 ENCOUNTER — Inpatient Hospital Stay (HOSPITAL_COMMUNITY)
Admission: EM | Admit: 2018-11-19 | Discharge: 2018-11-19 | Disposition: A | Discharge status: Home / Self Care | Payer: Medicaid Other | Attending: Obstetrics and Gynecology | Admitting: Obstetrics and Gynecology

## 2018-11-19 ENCOUNTER — Other Ambulatory Visit: Payer: Self-pay

## 2018-11-19 ENCOUNTER — Encounter (HOSPITAL_COMMUNITY): Payer: Self-pay

## 2018-11-19 ENCOUNTER — Inpatient Hospital Stay (HOSPITAL_BASED_OUTPATIENT_CLINIC_OR_DEPARTMENT_OTHER): Payer: Medicaid Other

## 2018-11-19 DIAGNOSIS — O34219 Maternal care for unspecified type scar from previous cesarean delivery: Secondary | ICD-10-CM | POA: Diagnosis not present

## 2018-11-19 DIAGNOSIS — A599 Trichomoniasis, unspecified: Secondary | ICD-10-CM | POA: Diagnosis not present

## 2018-11-19 DIAGNOSIS — F17210 Nicotine dependence, cigarettes, uncomplicated: Secondary | ICD-10-CM | POA: Insufficient documentation

## 2018-11-19 DIAGNOSIS — O98313 Other infections with a predominantly sexual mode of transmission complicating pregnancy, third trimester: Secondary | ICD-10-CM | POA: Insufficient documentation

## 2018-11-19 DIAGNOSIS — O2343 Unspecified infection of urinary tract in pregnancy, third trimester: Secondary | ICD-10-CM | POA: Insufficient documentation

## 2018-11-19 DIAGNOSIS — O09893 Supervision of other high risk pregnancies, third trimester: Secondary | ICD-10-CM | POA: Diagnosis not present

## 2018-11-19 DIAGNOSIS — O0933 Supervision of pregnancy with insufficient antenatal care, third trimester: Secondary | ICD-10-CM | POA: Insufficient documentation

## 2018-11-19 DIAGNOSIS — Z3A39 39 weeks gestation of pregnancy: Secondary | ICD-10-CM | POA: Diagnosis not present

## 2018-11-19 DIAGNOSIS — O99333 Smoking (tobacco) complicating pregnancy, third trimester: Secondary | ICD-10-CM | POA: Diagnosis not present

## 2018-11-19 DIAGNOSIS — O0930 Supervision of pregnancy with insufficient antenatal care, unspecified trimester: Secondary | ICD-10-CM

## 2018-11-19 DIAGNOSIS — Z1159 Encounter for screening for other viral diseases: Secondary | ICD-10-CM | POA: Diagnosis not present

## 2018-11-19 DIAGNOSIS — M545 Low back pain: Secondary | ICD-10-CM | POA: Diagnosis present

## 2018-11-19 LAB — DIFFERENTIAL
Abs Immature Granulocytes: 0.14 10*3/uL — ABNORMAL HIGH (ref 0.00–0.07)
Basophils Absolute: 0 10*3/uL (ref 0.0–0.1)
Basophils Relative: 0 %
Eosinophils Absolute: 0.1 10*3/uL (ref 0.0–0.5)
Eosinophils Relative: 1 %
Immature Granulocytes: 1 %
Lymphocytes Relative: 24 %
Lymphs Abs: 2.7 10*3/uL (ref 0.7–4.0)
Monocytes Absolute: 0.9 10*3/uL (ref 0.1–1.0)
Monocytes Relative: 8 %
Neutro Abs: 7.5 10*3/uL (ref 1.7–7.7)
Neutrophils Relative %: 66 %

## 2018-11-19 LAB — URINALYSIS, ROUTINE W REFLEX MICROSCOPIC
Bilirubin Urine: NEGATIVE
Glucose, UA: NEGATIVE mg/dL
Ketones, ur: NEGATIVE mg/dL
Nitrite: POSITIVE — AB
Protein, ur: 100 mg/dL — AB
Specific Gravity, Urine: 1.016 (ref 1.005–1.030)
WBC, UA: >50 WBC/hpf — ABNORMAL HIGH (ref 0–5)
pH: 7 (ref 5.0–8.0)

## 2018-11-19 LAB — RAPID URINE DRUG SCREEN, HOSP PERFORMED
Amphetamines: POSITIVE — AB
Barbiturates: NOT DETECTED
Benzodiazepines: NOT DETECTED
Cocaine: NOT DETECTED
Opiates: NOT DETECTED
Tetrahydrocannabinol: NOT DETECTED

## 2018-11-19 LAB — CBC
HCT: 28.5 % — ABNORMAL LOW (ref 36.0–46.0)
Hemoglobin: 8.9 g/dL — ABNORMAL LOW (ref 12.0–15.0)
MCH: 24.3 pg — ABNORMAL LOW (ref 26.0–34.0)
MCHC: 31.2 g/dL (ref 30.0–36.0)
MCV: 77.9 fL — ABNORMAL LOW (ref 80.0–100.0)
Platelets: 300 10*3/uL (ref 150–400)
RBC: 3.66 MIL/uL — ABNORMAL LOW (ref 3.87–5.11)
RDW: 17.1 % — ABNORMAL HIGH (ref 11.5–15.5)
WBC: 11.4 10*3/uL — ABNORMAL HIGH (ref 4.0–10.5)
nRBC: 0 % (ref 0.0–0.2)

## 2018-11-19 LAB — TYPE AND SCREEN
ABO/RH(D): A POS
Antibody Screen: NEGATIVE

## 2018-11-19 LAB — WET PREP, GENITAL
Clue Cells Wet Prep HPF POC: NONE SEEN
Sperm: NONE SEEN
Yeast Wet Prep HPF POC: NONE SEEN

## 2018-11-19 LAB — ABO/RH: ABO/RH(D): A POS

## 2018-11-19 MED ORDER — CEPHALEXIN 500 MG PO CAPS: 500.0000 mg | ORAL_CAPSULE | Freq: Four times a day (QID) | ORAL | 0 refills | Status: DC

## 2018-11-19 MED ORDER — METRONIDAZOLE 500 MG PO TABS
2000.0000 mg | ORAL_TABLET | Freq: Once | ORAL | Status: AC
  Administered 2018-11-19: 2000 mg via ORAL
  Filled 2018-11-19: qty 4

## 2018-11-19 NOTE — MAU Note (Addendum)
Amber Dillon is a 33 y.o. at 102w4d here in MAU reporting: back pain since the other day. Pain is intermittent. No bleeding or LOF. + FM. Pt states she has not had any PNC and is here because she knows she is getting close to her due date. Hx of 2 prior c/s.  Onset of complaint: the past few days  Pain score: 6/10  Vitals:   11/19/18 1357  BP: 113/65  Pulse: 100  Resp: 18  Temp: 97.8 F (36.6 C)  SpO2: 100%     FHT: + FM  Lab orders placed from triage: UA, pt states she just used the bathroom

## 2018-11-19 NOTE — Discharge Instructions (Signed)
How a Baby Grows During Pregnancy    Pregnancy begins when a female's sperm enters a female's egg (fertilization). Fertilization usually happens in one of the tubes (fallopian tubes) that connect the ovaries to the womb (uterus). The fertilized egg moves down the fallopian tube to the uterus. Once it reaches the uterus, it implants into the lining of the uterus and begins to grow.  For the first 10 weeks, the fertilized egg is called an embryo. After 10 weeks, it is called a fetus. As the fetus continues to grow, it receives oxygen and nutrients through tissue (placenta) that grows to support the developing baby. The placenta is the life support system for the baby. It provides oxygen and nutrition and removes waste.  Learning as much as you can about your pregnancy and how your baby is developing can help you enjoy the experience. It can also make you aware of when there might be a problem and when to ask questions.  How long does a typical pregnancy last?  A pregnancy usually lasts 280 days, or about 40 weeks. Pregnancy is divided into three periods of growth, also called trimesters:   First trimester: 0-12 weeks.   Second trimester: 13-27 weeks.   Third trimester: 28-40 weeks.  The day when your baby is ready to be born (full term) is your estimated date of delivery.  How does my baby develop month by month?  First month   The fertilized egg attaches to the inside of the uterus.   Some cells will form the placenta. Others will form the fetus.   The arms, legs, brain, spinal cord, lungs, and heart begin to develop.   At the end of the first month, the heart begins to beat.  Second month   The bones, inner ear, eyelids, hands, and feet form.   The genitals develop.   By the end of 8 weeks, all major organs are developing.  Third month   All of the internal organs are forming.   Teeth develop below the gums.   Bones and muscles begin to grow. The spine can flex.   The skin is transparent.   Fingernails  and toenails begin to form.   Arms and legs continue to grow longer, and hands and feet develop.   The fetus is about 3 inches (7.6 cm) long.  Fourth month   The placenta is completely formed.   The external sex organs, neck, outer ear, eyebrows, eyelids, and fingernails are formed.   The fetus can hear, swallow, and move its arms and legs.   The kidneys begin to produce urine.   The skin is covered with a white, waxy coating (vernix) and very fine hair (lanugo).  Fifth month   The fetus moves around more and can be felt for the first time (quickening).   The fetus starts to sleep and wake up and may begin to suck its finger.   The nails grow to the end of the fingers.   The organ in the digestive system that makes bile (gallbladder) functions and helps to digest nutrients.   If your baby is a girl, eggs are present in her ovaries. If your baby is a boy, testicles start to move down into his scrotum.  Sixth month   The lungs are formed.   The eyes open. The brain continues to develop.   Your baby has fingerprints and toe prints. Your baby's hair grows thicker.   At the end of the second trimester, the   fetus is about 9 inches (22.9 cm) long.  Seventh month   The fetus kicks and stretches.   The eyes are developed enough to sense changes in light.   The hands can make a grasping motion.   The fetus responds to sound.  Eighth month   All organs and body systems are fully developed and functioning.   Bones harden, and taste buds develop. The fetus may hiccup.   Certain areas of the brain are still developing. The skull remains soft.  Ninth month   The fetus gains about  lb (0.23 kg) each week.   The lungs are fully developed.   Patterns of sleep develop.   The fetus's head typically moves into a head-down position (vertex) in the uterus to prepare for birth.   The fetus weighs 6-9 lb (2.72-4.08 kg) and is 19-20 inches (48.26-50.8 cm) long.  What can I do to have a healthy pregnancy and help  my baby develop?  General instructions   Take prenatal vitamins as directed by your health care provider. These include vitamins such as folic acid, iron, calcium, and vitamin D. They are important for healthy development.   Take medicines only as directed by your health care provider. Read labels and ask a pharmacist or your health care provider whether over-the-counter medicines, supplements, and prescription drugs are safe to take during pregnancy.   Keep all follow-up visits as directed by your health care provider. This is important. Follow-up visits include prenatal care and screening tests.  How do I know if my baby is developing well?  At each prenatal visit, your health care provider will do several different tests to check on your health and keep track of your baby's development. These include:   Fundal height and position.  ? Your health care provider will measure your growing belly from your pubic bone to the top of the uterus using a tape measure.  ? Your health care provider will also feel your belly to determine your baby's position.   Heartbeat.  ? An ultrasound in the first trimester can confirm pregnancy and show a heartbeat, depending on how far along you are.  ? Your health care provider will check your baby's heart rate at every prenatal visit.   Second trimester ultrasound.  ? This ultrasound checks your baby's development. It also may show your baby's gender.  What should I do if I have concerns about my baby's development?  Always talk with your health care provider about any concerns that you may have about your pregnancy and your baby.  Summary   A pregnancy usually lasts 280 days, or about 40 weeks. Pregnancy is divided into three periods of growth, also called trimesters.   Your health care provider will monitor your baby's growth and development throughout your pregnancy.   Follow your health care provider's recommendations about taking prenatal vitamins and medicines during  your pregnancy.   Talk with your health care provider if you have any concerns about your pregnancy or your developing baby.  This information is not intended to replace advice given to you by your health care provider. Make sure you discuss any questions you have with your health care provider.  Document Released: 11/07/2007 Document Revised: 04/03/2017 Document Reviewed: 04/03/2017  Elsevier Interactive Patient Education  2019 Elsevier Inc[employer]

## 2018-11-19 NOTE — MAU Provider Note (Signed)
History     CSN: [id number]  Arrival date and time: 11/19/18 1331   First Provider Initiated Contact with Patient 11/19/18 1543      Chief Complaint  Patient presents with  . Back Pain   HPI   [name] is 33 y.o. female [phone] @ [id number] on suboxone,  with no prenatal care in current pregnancy, here for a check up. She had 1 vaginal delivery, then 2 previous c-sections. She has no pain/ no bleeding. Occasional lower back pain that comes and goes.  She is here to check up on baby.   OB History    Gravida  4   Para  3   Term  3   Preterm      AB      Living  3     SAB      TAB      Ectopic      Multiple  0   Live Births  3           History reviewed. No pertinent past medical history.  Past Surgical History:  Procedure Laterality Date  . CESAREAN SECTION N/A 12/12/2014   Procedure: CESAREAN SECTION;  Surgeon: Lazaro ArmsLuther H Eure, MD;  Location: [facility];  Service: Obstetrics;  Laterality: N/A;    History reviewed. No pertinent family history.  Social History   Tobacco Use  . Smoking status: Current Every Day Smoker    Packs/day: 1.00  . Smokeless tobacco: Never Used  Substance Use Topics  . Alcohol use: No  . Drug use: Not Currently    Comment: pt reports "opiate addiction" last use was 2018    Allergies: No Known Allergies  Medications Prior to Admission  Medication Sig Dispense Refill Last Dose  . buprenorphine-naloxone (SUBOXONE) 8-2 mg SUBL SL tablet Place 1 tablet under the tongue 2 (two) times a day.   11/18/2018 at Unknown time  . aspirin-acetaminophen-caffeine (EXCEDRIN MIGRAINE) 250-250-65 MG per tablet Take 1-2 tablets by mouth every 6 (six) hours as needed for headache (toothpain).     Marland Kitchen. HYDROmorphone (DILAUDID) 2 MG tablet Take 1-2 tablets (2-4 mg total) by mouth every 4 (four) hours as needed for moderate pain or severe pain. 30 tablet 0   . ibuprofen (ADVIL,MOTRIN) 600 MG tablet Take 1 tablet (600 mg total) by mouth every 6 (six)  hours. 30 tablet 0    Results for orders placed or performed during the hospital encounter of 11/19/18 (from the past 48 hour(s))  Urinalysis, Routine w reflex microscopic     Status: Abnormal   Collection Time: 11/19/18  2:45 PM  Result Value Ref Range   Color, Urine YELLOW YELLOW   APPearance CLOUDY (A) CLEAR   Specific Gravity, Urine 1.016 1.005 - 1.030   pH 7.0 5.0 - 8.0   Glucose, UA NEGATIVE NEGATIVE mg/dL   Hgb urine dipstick SMALL (A) NEGATIVE   Bilirubin Urine NEGATIVE NEGATIVE   Ketones, ur NEGATIVE NEGATIVE mg/dL   Protein, ur 130100 (A) NEGATIVE mg/dL   Nitrite POSITIVE (A) NEGATIVE   Leukocytes,Ua LARGE (A) NEGATIVE   RBC / HPF 21-50 0 - 5 RBC/hpf   WBC, UA >50 (H) 0 - 5 WBC/hpf   Bacteria, UA FEW (A) NONE SEEN   Squamous Epithelial / LPF 0-5 0 - 5   WBC Clumps PRESENT    Mucus PRESENT     Comment: Performed at [facility], [address] [place], [place] [address]  Urine rapid drug screen (  hosp performed)     Status: Abnormal   Collection Time: 11/19/18  2:45 PM  Result Value Ref Range   Opiates NONE DETECTED NONE DETECTED   Cocaine NONE DETECTED NONE DETECTED   Benzodiazepines NONE DETECTED NONE DETECTED   Amphetamines POSITIVE (A) NONE DETECTED   Tetrahydrocannabinol NONE DETECTED NONE DETECTED   Barbiturates NONE DETECTED NONE DETECTED    Comment: (NOTE) DRUG SCREEN FOR MEDICAL PURPOSES ONLY.  IF CONFIRMATION IS NEEDED FOR ANY PURPOSE, NOTIFY LAB WITHIN 5 DAYS. LOWEST DETECTABLE LIMITS FOR URINE DRUG SCREEN Drug Class                     Cutoff (ng/mL) Amphetamine and metabolites    1000 Barbiturate and metabolites    200 Benzodiazepine                 347 Tricyclics and metabolites     300 Opiates and metabolites        300 Cocaine and metabolites        300 THC                            50 Performed at [facility], [place] [address] [place], [place] [address]   CBC     Status: Abnormal   Collection Time: 11/19/18  2:46 PM  Result  Value Ref Range   WBC 11.4 (H) 4.0 - 10.5 K/uL   RBC 3.66 (L) 3.87 - 5.11 MIL/uL   Hemoglobin 8.9 (L) 12.0 - 15.0 g/dL    Comment: Reticulocyte Hemoglobin testing may be clinically indicated, consider ordering this additional test [id number]    HCT 28.5 (L) 36.0 - 46.0 %   MCV 77.9 (L) 80.0 - 100.0 fL   MCH 24.3 (L) 26.0 - 34.0 pg   MCHC 31.2 30.0 - 36.0 g/dL   RDW 17.1 (H) 11.5 - 15.5 %   Platelets 300 150 - 400 K/uL   nRBC 0.0 0.0 - 0.2 %    Comment: Performed at [facility], [place] [address], [place], [place] [address]  Differential     Status: Abnormal   Collection Time: 11/19/18  2:46 PM  Result Value Ref Range   Neutrophils Relative % 66 %   Neutro Abs 7.5 1.7 - 7.7 K/uL   Lymphocytes Relative 24 %   Lymphs Abs 2.7 0.7 - 4.0 K/uL   Monocytes Relative 8 %   Monocytes Absolute 0.9 0.1 - 1.0 K/uL   Eosinophils Relative 1 %   Eosinophils Absolute 0.1 0.0 - 0.5 K/uL   Basophils Relative 0 %   Basophils Absolute 0.0 0.0 - 0.1 K/uL   Immature Granulocytes 1 %   Abs Immature Granulocytes 0.14 (H) 0.00 - 0.07 K/uL    Comment: Performed at [place] [address], [place], [place] [address]  Type and screen [place]     Status: None   Collection Time: 11/19/18  2:50 PM  Result Value Ref Range   ABO/RH(D) A POS    Antibody Screen NEG    Sample Expiration      11/22/2018,2359 Performed at [facility], [place] [address], [place], [place] [address]   ABO/Rh     Status: None   Collection Time: 11/19/18  2:50 PM  Result Value Ref Range   ABO/RH(D)      A POS Performed at [place]  [address] [place], [place] [address]   Wet prep, genital     Status: Abnormal   Collection Time: 11/19/18  3:20 PM   Specimen: Cervical/Vaginal swab  Result Value Ref Range   Yeast Wet Prep HPF POC NONE SEEN NONE SEEN   Trich, Wet Prep PRESENT (A) NONE SEEN   Clue Cells Wet Prep HPF POC NONE SEEN NONE SEEN   WBC, Wet Prep HPF POC MANY (A)  NONE SEEN   Sperm NONE SEEN     Comment: Performed at [facility], [address] [place], [place] [address]   Review of Systems  Constitutional: Negative for fever.  Gastrointestinal: Negative for abdominal pain.  Genitourinary: Negative for vaginal bleeding and vaginal discharge.   Physical Exam   Blood pressure 111/71, pulse 98, temperature 98.1 F (36.7 C), temperature source Oral, resp. rate 16, height 5\' 9"  (1.753 m), weight 76.1 kg, last menstrual period 02/15/2018, SpO2 96 %, unknown if currently breastfeeding.  Physical Exam  Constitutional: She is oriented to person, place, and time. She appears well-developed and well-nourished. No distress.  HENT:  Head: Normocephalic.  GI: Soft. She exhibits no distension. There is no abdominal tenderness. There is no rebound and no guarding.  Musculoskeletal: Normal range of motion.  Neurological: She is alert and oriented to person, place, and time.  Skin: Skin is warm. She is not diaphoretic.  Psychiatric: Her behavior is normal.   Fetal Tracing: Baseline: 140 bpm Variability: Moderate  Accelerations: 15x15 Decelerations: None Toco: UI  MAU Course  Procedures  None  MDM  Prenatal panel collected GBS, GC, wet prep + trich> flagyl given Covid test collected Discussed patient with Dr. Alvester MorinNewton. Will send a message to [place] for Scheduling of C-section Urine culture.  Assessment and Plan   A:  1. Trichomonas infection   2. [redacted] weeks gestation of pregnancy   3. No prenatal care in current pregnancy   4. Urinary tract infection in mother during third trimester of pregnancy     P:  Discharge home in stable condition Office will call for scheduling C-section Labor precautions Kick counts  Keflex RX Partner needs treatment.    Duane Lopeasch, Jennifer I, NP 11/19/2018 6:33 PM

## 2018-11-20 LAB — RPR: RPR Ser Ql: NONREACTIVE

## 2018-11-20 LAB — CULTURE, BETA STREP (GROUP B ONLY)

## 2018-11-20 LAB — HEPATITIS B SURFACE ANTIGEN: Hepatitis B Surface Ag: NEGATIVE

## 2018-11-20 LAB — HIV ANTIBODY (ROUTINE TESTING W REFLEX): HIV Screen 4th Generation wRfx: NONREACTIVE

## 2018-11-20 LAB — GC/CHLAMYDIA PROBE AMP (~~LOC~~) NOT AT ARMC
Chlamydia: NEGATIVE
Neisseria Gonorrhea: NEGATIVE

## 2018-11-20 LAB — RUBELLA SCREEN: Rubella: 1.59 index (ref >0.99)

## 2018-11-20 LAB — NOVEL CORONAVIRUS, NAA (HOSP ORDER, SEND-OUT TO REF LAB; TAT 18-24 HRS): SARS-CoV-2, NAA: NOT DETECTED

## 2018-11-21 ENCOUNTER — Telehealth: Payer: Self-pay | Admitting: Obstetrics and Gynecology

## 2018-11-21 LAB — CULTURE, OB URINE: Culture: 3000 — AB

## 2018-11-21 NOTE — Telephone Encounter (Signed)
Telephone call to patient.  Patient has been added on for tomorrow morning for a repeat c/section.  Patient presented to MAU with no prenatal care.  Patient instructed to arrive at 6:30am and to be NPO.

## 2018-11-22 ENCOUNTER — Encounter: Payer: Self-pay | Admitting: Women's Health

## 2018-11-22 ENCOUNTER — Inpatient Hospital Stay (HOSPITAL_COMMUNITY)
Admission: RE | Admit: 2018-11-22 | Payer: Medicaid Other | Source: Home / Self Care | Admitting: Obstetrics and Gynecology

## 2018-11-22 ENCOUNTER — Encounter (HOSPITAL_COMMUNITY): Payer: Self-pay | Admitting: Anesthesiology

## 2018-11-22 ENCOUNTER — Encounter (HOSPITAL_COMMUNITY): Admission: RE | Payer: Self-pay | Source: Home / Self Care

## 2018-11-22 DIAGNOSIS — B951 Streptococcus, group B, as the cause of diseases classified elsewhere: Secondary | ICD-10-CM | POA: Insufficient documentation

## 2018-11-22 SURGERY — Surgical Case
Anesthesia: Regional

## 2018-11-22 MED ORDER — PHENYLEPHRINE HCL-NACL 20-0.9 MG/250ML-% IV SOLN
INTRAVENOUS | Status: AC
  Filled 2018-11-22: qty 250

## 2018-11-22 MED ORDER — ONDANSETRON HCL 4 MG/2ML IJ SOLN
INTRAMUSCULAR | Status: AC
  Filled 2018-11-22: qty 2

## 2018-11-22 MED ORDER — DEXAMETHASONE SODIUM PHOSPHATE 10 MG/ML IJ SOLN
INTRAMUSCULAR | Status: AC
  Filled 2018-11-22: qty 1

## 2018-11-22 MED ORDER — OXYTOCIN 40 UNITS IN NORMAL SALINE INFUSION - SIMPLE MED
INTRAVENOUS | Status: AC
  Filled 2018-11-22: qty 1000

## 2018-11-22 MED ORDER — SCOPOLAMINE 1 MG/3DAYS TD PT72
MEDICATED_PATCH | TRANSDERMAL | Status: AC
  Filled 2018-11-22: qty 1

## 2018-11-22 NOTE — Progress Notes (Signed)
Patient called at 7:00 AM and 7:36 AM to see if patient was going to present for c-section. No answer from patient. Voicemail left on patient phone.

## 2018-12-01 ENCOUNTER — Other Ambulatory Visit: Payer: Self-pay

## 2018-12-01 ENCOUNTER — Encounter (HOSPITAL_COMMUNITY)
Admission: AD | Disposition: A | Discharge status: Home / Self Care | Payer: Self-pay | Source: Home / Self Care | Attending: Obstetrics and Gynecology

## 2018-12-01 ENCOUNTER — Encounter (HOSPITAL_COMMUNITY): Payer: Self-pay | Admitting: *Deleted

## 2018-12-01 ENCOUNTER — Inpatient Hospital Stay (HOSPITAL_COMMUNITY): Payer: Medicaid Other | Admitting: Anesthesiology

## 2018-12-01 ENCOUNTER — Inpatient Hospital Stay (HOSPITAL_COMMUNITY)
Admission: AD | Admit: 2018-12-01 | Discharge: 2018-12-03 | DRG: 787 | Disposition: A | Discharge status: Home / Self Care | Payer: Medicaid Other | Attending: Obstetrics and Gynecology | Admitting: Obstetrics and Gynecology

## 2018-12-01 DIAGNOSIS — Z3A41 41 weeks gestation of pregnancy: Secondary | ICD-10-CM | POA: Diagnosis not present

## 2018-12-01 DIAGNOSIS — Z30430 Encounter for insertion of intrauterine contraceptive device: Secondary | ICD-10-CM | POA: Diagnosis not present

## 2018-12-01 DIAGNOSIS — Z1159 Encounter for screening for other viral diseases: Secondary | ICD-10-CM | POA: Diagnosis not present

## 2018-12-01 DIAGNOSIS — O34211 Maternal care for low transverse scar from previous cesarean delivery: Secondary | ICD-10-CM | POA: Diagnosis present

## 2018-12-01 DIAGNOSIS — O9902 Anemia complicating childbirth: Secondary | ICD-10-CM | POA: Diagnosis present

## 2018-12-01 DIAGNOSIS — F17210 Nicotine dependence, cigarettes, uncomplicated: Secondary | ICD-10-CM | POA: Diagnosis present

## 2018-12-01 DIAGNOSIS — O99334 Smoking (tobacco) complicating childbirth: Secondary | ICD-10-CM | POA: Diagnosis present

## 2018-12-01 DIAGNOSIS — D649 Anemia, unspecified: Secondary | ICD-10-CM | POA: Diagnosis present

## 2018-12-01 DIAGNOSIS — Z98891 History of uterine scar from previous surgery: Secondary | ICD-10-CM

## 2018-12-01 DIAGNOSIS — O99324 Drug use complicating childbirth: Secondary | ICD-10-CM | POA: Diagnosis present

## 2018-12-01 DIAGNOSIS — O480 Post-term pregnancy: Principal | ICD-10-CM | POA: Diagnosis present

## 2018-12-01 DIAGNOSIS — F1120 Opioid dependence, uncomplicated: Secondary | ICD-10-CM

## 2018-12-01 DIAGNOSIS — O99320 Drug use complicating pregnancy, unspecified trimester: Secondary | ICD-10-CM

## 2018-12-01 DIAGNOSIS — G8929 Other chronic pain: Secondary | ICD-10-CM | POA: Diagnosis present

## 2018-12-01 DIAGNOSIS — F1910 Other psychoactive substance abuse, uncomplicated: Secondary | ICD-10-CM | POA: Diagnosis present

## 2018-12-01 DIAGNOSIS — M549 Dorsalgia, unspecified: Secondary | ICD-10-CM | POA: Diagnosis present

## 2018-12-01 DIAGNOSIS — O479 False labor, unspecified: Secondary | ICD-10-CM | POA: Diagnosis present

## 2018-12-01 DIAGNOSIS — B951 Streptococcus, group B, as the cause of diseases classified elsewhere: Secondary | ICD-10-CM

## 2018-12-01 DIAGNOSIS — O99824 Streptococcus B carrier state complicating childbirth: Secondary | ICD-10-CM

## 2018-12-01 DIAGNOSIS — Z3009 Encounter for other general counseling and advice on contraception: Secondary | ICD-10-CM

## 2018-12-01 DIAGNOSIS — O0933 Supervision of pregnancy with insufficient antenatal care, third trimester: Secondary | ICD-10-CM

## 2018-12-01 LAB — RAPID URINE DRUG SCREEN, HOSP PERFORMED
Amphetamines: NOT DETECTED
Barbiturates: NOT DETECTED
Benzodiazepines: NOT DETECTED
Cocaine: POSITIVE — AB
Opiates: NOT DETECTED
Tetrahydrocannabinol: NOT DETECTED

## 2018-12-01 LAB — RPR: RPR Ser Ql: NONREACTIVE

## 2018-12-01 LAB — SARS CORONAVIRUS 2 BY RT PCR (HOSPITAL ORDER, PERFORMED IN ~~LOC~~ HOSPITAL LAB): SARS Coronavirus 2: NEGATIVE

## 2018-12-01 LAB — CREATININE, SERUM
Creatinine, Ser: 0.6 mg/dL (ref 0.44–1.00)
GFR calc Af Amer: >60 mL/min (ref >60)
GFR calc non Af Amer: >60 mL/min (ref >60)

## 2018-12-01 LAB — CBC
HCT: 29 % — ABNORMAL LOW (ref 36.0–46.0)
Hemoglobin: 8.9 g/dL — ABNORMAL LOW (ref 12.0–15.0)
MCH: 24.4 pg — ABNORMAL LOW (ref 26.0–34.0)
MCHC: 30.7 g/dL (ref 30.0–36.0)
MCV: 79.5 fL — ABNORMAL LOW (ref 80.0–100.0)
Platelets: 491 10*3/uL — ABNORMAL HIGH (ref 150–400)
RBC: 3.65 MIL/uL — ABNORMAL LOW (ref 3.87–5.11)
RDW: 18 % — ABNORMAL HIGH (ref 11.5–15.5)
WBC: 16.2 10*3/uL — ABNORMAL HIGH (ref 4.0–10.5)
nRBC: 0 % (ref 0.0–0.2)

## 2018-12-01 LAB — PREPARE RBC (CROSSMATCH)

## 2018-12-01 SURGERY — Surgical Case
Anesthesia: Spinal

## 2018-12-01 MED ORDER — OXYCODONE HCL 5 MG PO TABS: 5.0000 mg | ORAL_TABLET | Freq: Once | ORAL | Status: DC | PRN

## 2018-12-01 MED ORDER — LACTATED RINGERS IV SOLN
INTRAVENOUS | Status: DC
  Administered 2018-12-01 (×2): via INTRAVENOUS

## 2018-12-01 MED ORDER — SOD CITRATE-CITRIC ACID 500-334 MG/5ML PO SOLN
30.0000 mL | ORAL | Status: DC
  Filled 2018-12-01: qty 30

## 2018-12-01 MED ORDER — COCONUT OIL OIL: 1.0000 "application " | TOPICAL_OIL | Status: DC | PRN

## 2018-12-01 MED ORDER — DEXAMETHASONE SODIUM PHOSPHATE 10 MG/ML IJ SOLN
INTRAMUSCULAR | Status: AC
  Filled 2018-12-01: qty 1

## 2018-12-01 MED ORDER — MORPHINE SULFATE (PF) 0.5 MG/ML IJ SOLN
INTRAMUSCULAR | Status: DC | PRN
  Administered 2018-12-01: .15 mg via INTRATHECAL

## 2018-12-01 MED ORDER — SODIUM CHLORIDE 0.9 % IV SOLN
INTRAVENOUS | Status: DC | PRN
  Administered 2018-12-01: 04:00:00 via INTRAVENOUS

## 2018-12-01 MED ORDER — LACTATED RINGERS IV SOLN
INTRAVENOUS | Status: DC
  Administered 2018-12-01 – 2018-12-02 (×3): via INTRAVENOUS

## 2018-12-01 MED ORDER — DIBUCAINE (PERIANAL) 1 % EX OINT: 1.0000 "application " | TOPICAL_OINTMENT | CUTANEOUS | Status: DC | PRN

## 2018-12-01 MED ORDER — SIMETHICONE 80 MG PO CHEW
80.0000 mg | CHEWABLE_TABLET | Freq: Three times a day (TID) | ORAL | Status: DC
  Administered 2018-12-01 – 2018-12-03 (×6): 80 mg via ORAL
  Filled 2018-12-01 (×6): qty 1

## 2018-12-01 MED ORDER — BUPRENORPHINE HCL-NALOXONE HCL 8-2 MG SL SUBL
1.0000 | SUBLINGUAL_TABLET | Freq: Two times a day (BID) | SUBLINGUAL | Status: DC
  Administered 2018-12-01 – 2018-12-03 (×5): 1 via SUBLINGUAL
  Filled 2018-12-01 (×5): qty 1

## 2018-12-01 MED ORDER — ENOXAPARIN SODIUM 40 MG/0.4ML ~~LOC~~ SOLN
40.0000 mg | SUBCUTANEOUS | Status: DC
  Administered 2018-12-02 – 2018-12-03 (×2): 40 mg via SUBCUTANEOUS
  Filled 2018-12-01 (×2): qty 0.4

## 2018-12-01 MED ORDER — PHENYLEPHRINE HCL-NACL 20-0.9 MG/250ML-% IV SOLN
INTRAVENOUS | Status: DC | PRN
  Administered 2018-12-01: 60 ug/min via INTRAVENOUS

## 2018-12-01 MED ORDER — ONDANSETRON HCL 4 MG/2ML IJ SOLN: 4.0000 mg | Freq: Three times a day (TID) | INTRAMUSCULAR | Status: DC | PRN

## 2018-12-01 MED ORDER — BUPIVACAINE IN DEXTROSE 0.75-8.25 % IT SOLN
INTRATHECAL | Status: DC | PRN
  Administered 2018-12-01: 1.6 mL via INTRATHECAL

## 2018-12-01 MED ORDER — SODIUM CHLORIDE 0.9 % IV SOLN
INTRAVENOUS | Status: DC | PRN
  Administered 2018-12-01: 40 [IU] via INTRAVENOUS

## 2018-12-01 MED ORDER — ACETAMINOPHEN 325 MG PO TABS
650.0000 mg | ORAL_TABLET | ORAL | Status: DC | PRN
  Administered 2018-12-01 (×2): 650 mg via ORAL
  Filled 2018-12-01 (×2): qty 2

## 2018-12-01 MED ORDER — FENTANYL CITRATE (PF) 100 MCG/2ML IJ SOLN
INTRAMUSCULAR | Status: DC | PRN
  Administered 2018-12-01: 15 ug via INTRATHECAL
  Administered 2018-12-01: 10 ug via INTRAVENOUS

## 2018-12-01 MED ORDER — OXYTOCIN 40 UNITS IN NORMAL SALINE INFUSION - SIMPLE MED: 2.5000 [IU]/h | INTRAVENOUS | Status: AC

## 2018-12-01 MED ORDER — HYDROMORPHONE HCL 2 MG PO TABS: 1.0000 mg | ORAL_TABLET | ORAL | Status: DC | PRN

## 2018-12-01 MED ORDER — WITCH HAZEL-GLYCERIN EX PADS: 1.0000 "application " | MEDICATED_PAD | CUTANEOUS | Status: DC | PRN

## 2018-12-01 MED ORDER — SIMETHICONE 80 MG PO CHEW: 80.0000 mg | CHEWABLE_TABLET | ORAL | Status: DC | PRN

## 2018-12-01 MED ORDER — DEXAMETHASONE SODIUM PHOSPHATE 4 MG/ML IJ SOLN
INTRAMUSCULAR | Status: AC
  Filled 2018-12-01: qty 1

## 2018-12-01 MED ORDER — NALBUPHINE HCL 10 MG/ML IJ SOLN: 5.0000 mg | INTRAMUSCULAR | Status: DC | PRN

## 2018-12-01 MED ORDER — HYDROMORPHONE HCL 1 MG/ML IJ SOLN
INTRAMUSCULAR | Status: AC
  Filled 2018-12-01: qty 0.5

## 2018-12-01 MED ORDER — PROMETHAZINE HCL 25 MG/ML IJ SOLN: 6.2500 mg | INTRAMUSCULAR | Status: DC | PRN

## 2018-12-01 MED ORDER — IBUPROFEN 800 MG PO TABS
800.0000 mg | ORAL_TABLET | Freq: Three times a day (TID) | ORAL | Status: DC
  Administered 2018-12-01 – 2018-12-03 (×6): 800 mg via ORAL
  Filled 2018-12-01 (×6): qty 1

## 2018-12-01 MED ORDER — NALOXONE HCL 0.4 MG/ML IJ SOLN: 0.4000 mg | INTRAMUSCULAR | Status: DC | PRN

## 2018-12-01 MED ORDER — OXYCODONE HCL 5 MG/5ML PO SOLN: 5.0000 mg | Freq: Once | ORAL | Status: DC | PRN

## 2018-12-01 MED ORDER — PHENYLEPHRINE 40 MCG/ML (10ML) SYRINGE FOR IV PUSH (FOR BLOOD PRESSURE SUPPORT)
PREFILLED_SYRINGE | INTRAVENOUS | Status: AC
  Filled 2018-12-01: qty 10

## 2018-12-01 MED ORDER — ZOLPIDEM TARTRATE 5 MG PO TABS: 5.0000 mg | ORAL_TABLET | Freq: Every evening | ORAL | Status: DC | PRN

## 2018-12-01 MED ORDER — BUPIVACAINE HCL (PF) 0.5 % IJ SOLN
INTRAMUSCULAR | Status: DC | PRN
  Administered 2018-12-01: 30 mL

## 2018-12-01 MED ORDER — SODIUM CHLORIDE 0.9 % IR SOLN
Status: DC | PRN
  Administered 2018-12-01: 1000 mL

## 2018-12-01 MED ORDER — MENTHOL 3 MG MT LOZG: 1.0000 | LOZENGE | OROMUCOSAL | Status: DC | PRN

## 2018-12-01 MED ORDER — TETANUS-DIPHTH-ACELL PERTUSSIS 5-2.5-18.5 LF-MCG/0.5 IM SUSP: 0.5000 mL | Freq: Once | INTRAMUSCULAR | Status: DC

## 2018-12-01 MED ORDER — SODIUM CHLORIDE 0.9% FLUSH: 3.0000 mL | INTRAVENOUS | Status: DC | PRN

## 2018-12-01 MED ORDER — FENTANYL CITRATE (PF) 100 MCG/2ML IJ SOLN
INTRAMUSCULAR | Status: AC
  Filled 2018-12-01: qty 2

## 2018-12-01 MED ORDER — LEVONORGESTREL 19.5 MCG/DAY IU IUD
INTRAUTERINE_SYSTEM | INTRAUTERINE | Status: AC
  Filled 2018-12-01: qty 1

## 2018-12-01 MED ORDER — KETOROLAC TROMETHAMINE 30 MG/ML IJ SOLN
INTRAMUSCULAR | Status: AC
  Filled 2018-12-01: qty 1

## 2018-12-01 MED ORDER — LEVONORGESTREL 19.5 MCG/DAY IU IUD
INTRAUTERINE_SYSTEM | Freq: Once | INTRAUTERINE | Status: AC
  Administered 2018-12-01: 04:00:00 via INTRAUTERINE

## 2018-12-01 MED ORDER — DIPHENHYDRAMINE HCL 50 MG/ML IJ SOLN: 12.5000 mg | INTRAMUSCULAR | Status: DC | PRN

## 2018-12-01 MED ORDER — OXYTOCIN 40 UNITS IN NORMAL SALINE INFUSION - SIMPLE MED
INTRAVENOUS | Status: AC
  Filled 2018-12-01: qty 1000

## 2018-12-01 MED ORDER — MORPHINE SULFATE (PF) 0.5 MG/ML IJ SOLN
INTRAMUSCULAR | Status: AC
  Filled 2018-12-01: qty 10

## 2018-12-01 MED ORDER — KETOROLAC TROMETHAMINE 30 MG/ML IJ SOLN
30.0000 mg | Freq: Once | INTRAMUSCULAR | Status: AC | PRN
  Administered 2018-12-01: 30 mg via INTRAVENOUS

## 2018-12-01 MED ORDER — HYDROMORPHONE HCL 1 MG/ML IJ SOLN
0.2500 mg | INTRAMUSCULAR | Status: DC | PRN
  Administered 2018-12-01 (×3): 0.5 mg via INTRAVENOUS

## 2018-12-01 MED ORDER — PRENATAL MULTIVITAMIN CH
1.0000 | ORAL_TABLET | Freq: Every day | ORAL | Status: DC
  Administered 2018-12-02: 14:00:00 1 via ORAL
  Filled 2018-12-01 (×2): qty 1

## 2018-12-01 MED ORDER — PHENYLEPHRINE HCL-NACL 20-0.9 MG/250ML-% IV SOLN
INTRAVENOUS | Status: AC
  Filled 2018-12-01: qty 250

## 2018-12-01 MED ORDER — DEXAMETHASONE SODIUM PHOSPHATE 4 MG/ML IJ SOLN
INTRAMUSCULAR | Status: DC | PRN
  Administered 2018-12-01: 4 mg via INTRAVENOUS

## 2018-12-01 MED ORDER — DIPHENHYDRAMINE HCL 25 MG PO CAPS: 25.0000 mg | ORAL_CAPSULE | Freq: Four times a day (QID) | ORAL | Status: DC | PRN

## 2018-12-01 MED ORDER — DIPHENHYDRAMINE HCL 50 MG/ML IJ SOLN
INTRAMUSCULAR | Status: DC | PRN
  Administered 2018-12-01: 25 mg via INTRAVENOUS

## 2018-12-01 MED ORDER — SCOPOLAMINE 1 MG/3DAYS TD PT72
MEDICATED_PATCH | TRANSDERMAL | Status: DC | PRN
  Administered 2018-12-01: 1 via TRANSDERMAL

## 2018-12-01 MED ORDER — METOCLOPRAMIDE HCL 5 MG/ML IJ SOLN
INTRAMUSCULAR | Status: AC
  Filled 2018-12-01: qty 2

## 2018-12-01 MED ORDER — ONDANSETRON HCL 4 MG/2ML IJ SOLN
INTRAMUSCULAR | Status: DC | PRN
  Administered 2018-12-01: 4 mg via INTRAVENOUS

## 2018-12-01 MED ORDER — NALBUPHINE HCL 10 MG/ML IJ SOLN: 5.0000 mg | Freq: Once | INTRAMUSCULAR | Status: DC | PRN

## 2018-12-01 MED ORDER — DIPHENHYDRAMINE HCL 50 MG/ML IJ SOLN
INTRAMUSCULAR | Status: AC
  Filled 2018-12-01: qty 1

## 2018-12-01 MED ORDER — CEFAZOLIN SODIUM-DEXTROSE 2-4 GM/100ML-% IV SOLN
2.0000 g | INTRAVENOUS | Status: AC
  Administered 2018-12-01: 2 g via INTRAVENOUS
  Filled 2018-12-01: qty 100

## 2018-12-01 MED ORDER — OXYCODONE HCL 5 MG PO TABS
5.0000 mg | ORAL_TABLET | ORAL | Status: DC | PRN
  Administered 2018-12-01 – 2018-12-02 (×5): 5 mg via ORAL
  Administered 2018-12-03: 10 mg via ORAL
  Administered 2018-12-03: 5 mg via ORAL
  Filled 2018-12-01: qty 1
  Filled 2018-12-01: qty 2
  Filled 2018-12-01 (×3): qty 1
  Filled 2018-12-01: qty 2
  Filled 2018-12-01: qty 1

## 2018-12-01 MED ORDER — BUPIVACAINE HCL (PF) 0.5 % IJ SOLN
INTRAMUSCULAR | Status: AC
  Filled 2018-12-01: qty 30

## 2018-12-01 MED ORDER — SENNOSIDES-DOCUSATE SODIUM 8.6-50 MG PO TABS
2.0000 | ORAL_TABLET | ORAL | Status: DC
  Administered 2018-12-01 – 2018-12-03 (×2): 2 via ORAL
  Filled 2018-12-01 (×2): qty 2

## 2018-12-01 MED ORDER — DIPHENHYDRAMINE HCL 25 MG PO CAPS: 25.0000 mg | ORAL_CAPSULE | ORAL | Status: DC | PRN

## 2018-12-01 MED ORDER — PHENYLEPHRINE HCL (PRESSORS) 10 MG/ML IV SOLN
INTRAVENOUS | Status: DC | PRN
  Administered 2018-12-01 (×4): 80 ug via INTRAVENOUS

## 2018-12-01 MED ORDER — METOCLOPRAMIDE HCL 5 MG/ML IJ SOLN
INTRAMUSCULAR | Status: DC | PRN
  Administered 2018-12-01: 10 mg via INTRAVENOUS

## 2018-12-01 MED ORDER — ONDANSETRON HCL 4 MG/2ML IJ SOLN
INTRAMUSCULAR | Status: AC
  Filled 2018-12-01: qty 2

## 2018-12-01 MED ORDER — LACTATED RINGERS IV BOLUS: 1000.0000 mL | Freq: Once | INTRAVENOUS | Status: DC

## 2018-12-01 MED ORDER — SIMETHICONE 80 MG PO CHEW
80.0000 mg | CHEWABLE_TABLET | ORAL | Status: DC
  Administered 2018-12-01 – 2018-12-03 (×2): 80 mg via ORAL
  Filled 2018-12-01 (×2): qty 1

## 2018-12-01 MED ORDER — SCOPOLAMINE 1 MG/3DAYS TD PT72
1.0000 | MEDICATED_PATCH | Freq: Once | TRANSDERMAL | Status: DC
  Filled 2018-12-01: qty 1

## 2018-12-01 MED ORDER — NALOXONE HCL 4 MG/10ML IJ SOLN
1.0000 ug/kg/h | INTRAVENOUS | Status: DC | PRN
  Filled 2018-12-01: qty 5

## 2018-12-01 MED ORDER — SODIUM CHLORIDE 0.9% IV SOLUTION: Freq: Once | INTRAVENOUS | Status: DC

## 2018-12-01 SURGICAL SUPPLY — 39 items
APL SKNCLS STERI-STRIP NONHPOA (GAUZE/BANDAGES/DRESSINGS)
BENZOIN TINCTURE PRP APPL 2/3 (GAUZE/BANDAGES/DRESSINGS) IMPLANT
CHLORAPREP W/TINT 26ML (MISCELLANEOUS) ×2 IMPLANT
CLAMP CORD UMBIL (MISCELLANEOUS) IMPLANT
CLOTH BEACON ORANGE TIMEOUT ST (SAFETY) ×2 IMPLANT
DRSG OPSITE POSTOP 4X10 (GAUZE/BANDAGES/DRESSINGS) ×2 IMPLANT
ELECT REM PT RETURN 9FT ADLT (ELECTROSURGICAL) ×2
ELECTRODE REM PT RTRN 9FT ADLT (ELECTROSURGICAL) ×1 IMPLANT
EXTRACTOR VACUUM BELL STYLE (SUCTIONS) IMPLANT
GLOVE BIOGEL PI IND STRL 6.5 (GLOVE) ×1 IMPLANT
GLOVE BIOGEL PI IND STRL 7.0 (GLOVE) ×2 IMPLANT
GLOVE BIOGEL PI INDICATOR 6.5 (GLOVE) ×1
GLOVE BIOGEL PI INDICATOR 7.0 (GLOVE) ×2
GLOVE ORTHOPEDIC STR SZ6.5 (GLOVE) ×2 IMPLANT
GOWN STRL REUS W/TWL LRG LVL3 (GOWN DISPOSABLE) ×6 IMPLANT
HEMOSTAT SURGICEL 2X14 (HEMOSTASIS) ×1 IMPLANT
KIT ABG SYR 3ML LUER SLIP (SYRINGE) IMPLANT
NDL HYPO 25X1 1.5 SAFETY (NEEDLE) IMPLANT
NEEDLE HYPO 22GX1.5 SAFETY (NEEDLE) ×2 IMPLANT
NEEDLE HYPO 25X1 1.5 SAFETY (NEEDLE) IMPLANT
NS IRRIG 1000ML POUR BTL (IV SOLUTION) ×2 IMPLANT
PACK C SECTION WH (CUSTOM PROCEDURE TRAY) ×2 IMPLANT
PAD ABD 8X10 STRL (GAUZE/BANDAGES/DRESSINGS) ×2 IMPLANT
PAD OB MATERNITY 4.3X12.25 (PERSONAL CARE ITEMS) ×2 IMPLANT
PENCIL SMOKE EVAC W/HOLSTER (ELECTROSURGICAL) ×2 IMPLANT
SPONGE GAUZE 4X4 12PLY STER LF (GAUZE/BANDAGES/DRESSINGS) ×2 IMPLANT
STRIP CLOSURE SKIN 1/2X4 (GAUZE/BANDAGES/DRESSINGS) IMPLANT
SUT MON AB 4-0 PS1 27 (SUTURE) ×2 IMPLANT
SUT PDS AB 0 CTX 60 (SUTURE) ×1 IMPLANT
SUT PLAIN 2 0 (SUTURE) ×2
SUT PLAIN ABS 2-0 CT1 27XMFL (SUTURE) ×1 IMPLANT
SUT VIC AB 0 CT1 36 (SUTURE) ×4 IMPLANT
SUT VIC AB 0 CTX 36 (SUTURE) ×2
SUT VIC AB 0 CTX36XBRD ANBCTRL (SUTURE) ×1 IMPLANT
SYR CONTROL 10ML LL (SYRINGE) ×2 IMPLANT
TAPE CLOTH SURG 6X10 WHT LF (GAUZE/BANDAGES/DRESSINGS) ×1 IMPLANT
TOWEL OR 17X24 6PK STRL BLUE (TOWEL DISPOSABLE) ×2 IMPLANT
TRAY FOLEY W/BAG SLVR 14FR LF (SET/KITS/TRAYS/PACK) ×2 IMPLANT
WATER STERILE IRR 1000ML POUR (IV SOLUTION) ×2 IMPLANT

## 2018-12-01 NOTE — Anesthesia Procedure Notes (Signed)
Spinal  Patient location during procedure: OB End time: 12/01/2018 3:13 AM Staffing Anesthesiologist: Lynda Rainwater, MD Performed: anesthesiologist  Preanesthetic Checklist Completed: patient identified, surgical consent, pre-op evaluation, timeout performed, IV checked, risks and benefits discussed and monitors and equipment checked Spinal Block Patient position: sitting Prep: site prepped and draped and DuraPrep Patient monitoring: heart rate, cardiac monitor, continuous pulse ox and blood pressure Approach: midline Location: L3-4 Injection technique: single-shot Needle Needle type: Pencan  Needle gauge: 24 G Needle length: 10 cm Assessment Sensory level: T4

## 2018-12-01 NOTE — Anesthesia Preprocedure Evaluation (Signed)
Anesthesia Evaluation  Patient identified by MRN, date of birth, ID band Patient awake    Reviewed: Allergy & Precautions, H&P , NPO status , Patient's Chart, lab work & pertinent test results  Airway Mallampati: II  TM Distance: >3 FB Neck ROM: Full    Dental no notable dental hx. (+) Teeth Intact   Pulmonary neg pulmonary ROS, Current Smoker,    Pulmonary exam normal breath sounds clear to auscultation       Cardiovascular negative cardio ROS Normal cardiovascular exam Rhythm:Regular Rate:Normal     Neuro/Psych negative neurological ROS  negative psych ROS   GI/Hepatic negative GI ROS, Neg liver ROS,   Endo/Other  negative endocrine ROS  Renal/GU negative Renal ROS     Musculoskeletal   Abdominal   Peds  Hematology negative hematology ROS (+)   Anesthesia Other Findings   Reproductive/Obstetrics (+) Pregnancy                             Anesthesia Physical  Anesthesia Plan  ASA: II  Anesthesia Plan: Spinal   Post-op Pain Management:    Induction:   PONV Risk Score and Plan:   Airway Management Planned:   Additional Equipment:   Intra-op Plan:   Post-operative Plan:   Informed Consent: I have reviewed the patients History and Physical, chart, labs and discussed the procedure including the risks, benefits and alternatives for the proposed anesthesia with the patient or authorized representative who has indicated his/her understanding and acceptance.     Dental Advisory Given  Plan Discussed with: Anesthesiologist, CRNA and Surgeon  Anesthesia Plan Comments:         Anesthesia Quick Evaluation

## 2018-12-01 NOTE — Clinical Social Work Maternal (Addendum)
CLINICAL SOCIAL WORK MATERNAL/CHILD NOTE  Patient Details  Name: Amber Dillon MRN: [id number] Date of Birth: 09/22/1985  Date:  12/01/2018  Clinical Social Worker Initiating Note:  Thaddeaus Monica, LCSW     Date/Time: Initiated:  12/01/18/1400             Child's Name:  Undecided Last name will be Pfannenstiel   Biological Parents:  Mother, Father(Father: Micheal Hoffmaster)   Need for Interpreter:  None   Reason for Referral:  Current Substance Use/Substance Use During Pregnancy , Other (Comment)(NICU Admission)   Address:  [address] [place] [address]    Phone number:  [phone] (home)     Additional phone number:   Household Members/Support Persons (HM/SP):   Household Member/Support Person 1, Household Member/Support Person 2, Household Member/Support Person 3   HM/SP Name Relationship DOB or Age  HM/SP -1 Micheal Mollica FOB/Husband 08-29-87  HM/SP -2 Kenzie Qadir daughter 09-23-17  HM/SP -3 Robert Garciagarcia FOB's father    HM/SP -4        HM/SP -5        HM/SP -6        HM/SP -7        HM/SP -8          Natural Supports (not living in the home): Other (Comment)(FOB's family)   Professional Supports:None   Employment:Unemployed   Type of Work:     Education:  9 to 11 years(9th Grade)   Homebound arranged: No  Financial Resources:Medicaid   Other Resources: Food Stamps , WIC   Cultural/Religious Considerations Which May Impact Care:   Strengths: Home prepared for child , Ability to meet basic needs    Psychotropic Medications:         Pediatrician:       Pediatrician List:   [place]    [place]    [place]    [place]    [place]    [place]      Pediatrician Fax Number:    Risk Factors/Current Problems: Substance Use    Cognitive State: Alert , Able to Concentrate , Linear Thinking , Goal Oriented    Mood/Affect: Calm , Interested    CSW Assessment:CSW met with [name]  at bedside to discuss consult for current substance use and infant's NICU admission, FOB/Husband present. CSW asked FOB to leave during assessment, FOB left voluntarily. CSW introduced self and explained reason for consult. [name] appeared tired but remained engaged throughout assessment. [name] reported that she resides with FOB and their daughter. [name] reported that she has two older daughters (Kylie Chaidez 05-19-09; Kori Guagliardo 12-12-14) that were removed from her home due to CPS involvement. [name] reported that she does not communicate with her two older daughters. [name] reported that they reside with family friends Lindsey and Joey Townsend and reported that she doesn't have an address nor telephone number for them. [name] reported that she doesn't currently have any open CPS cases. [name] reported that she is unemployed and receives both WIC and Food Stamps. [name] reported that she has all items needed to care for infant including a car seat and crib. [name] reported that the plan is for FOB's father (Robert Scheiderer [phone];; [address] [place]  [address]) to be primary care giver for infant and that they are thinking of transferring custody temporarily. CSW inquired about [name]'s support system, [name] reported that FOB's family (FOB's dad, FOB's dad's girlfriend and FOB's grandma) are her supports.   CSW inquired   about [name]'s mental health history, [name] denied any mental health history. [name] denied a history of postpartum depression with older children. [name] presented calm and tired but remained engaged throughout assessment. [name] did not demonstrate any acute mental health signs/symptoms. CSW assessed for safety, [name] denied SI, HI and domestic violence.   CSW provided education regarding the baby blues period vs. perinatal mood disorders, discussed treatment and gave resources for mental health follow up if concerns arise.  CSW recommends self-evaluation during the postpartum time period using the New Mom Checklist from Postpartum  Progress and encouraged [name] to contact a medical professional if symptoms are noted at any time.    CSW provided review of Sudden Infant Death Syndrome (SIDS) precautions.    CSW and [name] discussed infant's NICU Admission. [name] reported that her husband is keeping her updated on infant's medical status. CSW asked if [name] wanted to receive any updates directly from medical providers, [name] reported that getting updates from her husband was fine. CSW informed [name] about the NICU, [name] denied any questions/concerns.   CSW informed [name] about hospital drug policy due to no prenatal care. [name] reported that she did not have prenatal care and reported there wasn't a reason why she didn't get prenatal care. CSW inquired about substance use during pregnancy, [name] denied any substance use during pregnancy and reported that she is taking Suboxone that is prescribed weekly by [facility] located in [place]. [name] reported that she has been on Suboxone for a couple years and it is working good. CSW informed [name] that infant's UDS was positive for cocaine. [name] reported that she didn't know how and reported that the only thing she did was smoke marijuana last week. [name] denied any other substance use during pregnancy. CSW asked [name] if she knew why infant's UDS was positive for cocaine, [name] reported that she didn't know why unless the weed was laced. [name] reported "I haven't messed with cocaine in years". CSW asked if [name] was interested in substance use treatment resources, [name] declined. CSW informed [name] that a CPS report would be made. [name] verbalized understanding and denied any questions/concerns.   CSW made a CPS report to [place] CPS Intake SW Michelle.  CSW awaiting call from [place] CPS SW with updates regarding infant's discharge plan.  CSW will continue to offer support/resources while infant is admitted to the NICU.    CSW Plan/Description: Sudden Infant Death Syndrome (SIDS) Education,  Perinatal Mood and Anxiety Disorder (PMADs) Education, Hospital Drug Screen Policy Information, CSW Will Continue to Monitor Umbilical Cord Tissue Drug Screen Results and Make Report if Warranted, Child Protective Service Report , CSW Awaiting CPS Disposition Plan, Other Patient/Family Education    Briena Swingler L Karmela Bram, LCSW 12/01/2018, 2:35 PM           

## 2018-12-01 NOTE — Anesthesia Postprocedure Evaluation (Signed)
Anesthesia Post Note  Patient: Tommie Ard  Procedure(s) Performed: CESAREAN SECTION (N/A )     Patient location during evaluation: PACU Anesthesia Type: Spinal Level of consciousness: oriented and awake and alert Pain management: pain level controlled Vital Signs Assessment: post-procedure vital signs reviewed and stable Respiratory status: spontaneous breathing and respiratory function stable Cardiovascular status: blood pressure returned to baseline and stable Postop Assessment: no headache, no backache and no apparent nausea or vomiting Anesthetic complications: no    Last Vitals:  Vitals:   12/01/18 0545 12/01/18 0555  BP:  109/74  Pulse: 91 92  Resp: 13 16  Temp:  36.9 C  SpO2: 98% 98%    Last Pain:  Vitals:   12/01/18 0555  TempSrc: Oral  PainSc: 4    Pain Goal:                Epidural/Spinal Function Cutaneous sensation: Able to Wiggle Toes (12/01/18 0555), Patient able to flex knees: Yes (12/01/18 0555), Patient able to lift hips off bed: No (12/01/18 0555), Back pain beyond tenderness at insertion site: No (12/01/18 0555), Progressively worsening motor and/or sensory loss: No (12/01/18 0555), Bowel and/or bladder incontinence post epidural: No (12/01/18 0555)  Lynda Rainwater

## 2018-12-01 NOTE — H&P (Addendum)
Obstetric History and Physical  Amber Dillon is a 33 y.o. [phone] with IUP at 2332w2d presenting for contractions. Patient states she has been having regular contractions and they are extremely painful. Reports normal fetal movement. Denies leaking of fluid, denies vaginal bleeding.     Prenatal Course Source of Care: Has had no prenatal care Pregnancy complications or risks: Patient Active Problem List   Diagnosis Date Noted  . History of C-section 12/01/2018  . GBS (group B streptococcus) UTI complicating pregnancy 11/22/2018  . No prenatal care in current pregnancy in third trimester 11/19/2018  . S/P emergency cesarean section 12/12/2014  . Chronic back pain 08/12/2012   She undecided regarding feeding plan. She desires IUD for postpartum contraception.   Prenatal labs and studies: ABO, Rh: --/--/A POS (06/29 0125) Antibody: NEG (06/29 0125) Rubella: 1.59 (06/17 1446) RPR: Non Reactive (06/17 1446)  HBsAg: Negative (06/17 1446)  HIV: Non Reactive (06/17 1446)  GBS:  2 hr GTT:  none Genetic screening none Anatomy US none  Medical History:  History reviewed. No pertinent past medical history.  Past Surgical History:  Procedure Laterality Date  . CESAREAN SECTION N/A 12/12/2014   Procedure: CESAREAN SECTION;  Surgeon: Lazaro ArmsLuther H Eure, MD;  Location: [facility];  Service: Obstetrics;  Laterality: N/A;  . TONSILLECTOMY      OB History  Gravida Para Term Preterm AB Living  4 3 3     3   SAB TAB Ectopic Multiple Live Births        0 3    # Outcome Date GA Lbr Len/2nd Weight Sex Delivery Anes PTL Lv  4 Current           3 Term 09/23/17     CS-Unspec   LIV  2 Term 12/12/14 1072w4d  2821 g F CS-LTranv Gen  LIV  1 Term      Vag-Spont   LIV    Social History   Socioeconomic History  . Marital status: Married    Spouse name: Not on file  . Number of children: Not on file  . Years of education: Not on file  . Highest education level: Not on file  Occupational History   . Not on file  Social Needs  . Financial resource strain: Not on file  . Food insecurity    Worry: Not on file    Inability: Not on file  . Transportation needs    Medical: Not on file    Non-medical: Not on file  Tobacco Use  . Smoking status: Current Every Day Smoker    Packs/day: 1.00  . Smokeless tobacco: Never Used  Substance and Sexual Activity  . Alcohol use: No  . Drug use: Not Currently    Types: Other-see comments    Comment: on suboxone  . Sexual activity: Yes  Lifestyle  . Physical activity    Days per week: Not on file    Minutes per session: Not on file  . Stress: Not on file  Relationships  . Social Musicianconnections    Talks on phone: Not on file    Gets together: Not on file    Attends religious service: Not on file    Active member of club or organization: Not on file    Attends meetings of clubs or organizations: Not on file    Relationship status: Not on file  Other Topics Concern  . Not on file  Social History Narrative  . Not on file  History reviewed. No pertinent family history.  Medications Prior to Admission  Medication Sig Dispense Refill Last Dose  . buprenorphine-naloxone (SUBOXONE) 8-2 mg SUBL SL tablet Place 1 tablet under the tongue 2 (two) times a day.   12/01/2018 at Unknown time  . cephALEXin (KEFLEX) 500 MG capsule Take 1 capsule (500 mg total) by mouth 4 (four) times daily. 28 capsule 0 12/01/2018 at Unknown time   No Known Allergies  Review of Systems: Negative except for what is mentioned in HPI.  Physical Exam: BP 121/83 (BP Location: Right Arm)   Pulse (!) 107   Temp 98.3 F (36.8 C) (Oral)   Resp 18   Ht 5\' 9"  (1.753 m)   Wt 73.9 kg   LMP 02/15/2018   BMI 24.07 kg/m  CONSTITUTIONAL: Well-developed, well-nourished female in no acute distress.  HENT:  Normocephalic, atraumatic, External right and left ear normal. Oropharynx is clear and moist EYES: Conjunctivae and EOM are normal. Pupils are equal, round, and reactive  to light. No scleral icterus.  NECK: Normal range of motion, supple, no masses SKIN: Skin is warm and dry. No rash noted. Not diaphoretic. No erythema. No pallor. NEUROLOGIC: Alert and oriented to person, place, and time. Normal reflexes, muscle tone coordination. No cranial nerve deficit noted. PSYCHIATRIC: Normal mood and affect. Normal behavior. Normal judgment and thought content. CARDIOVASCULAR: Normal heart rate noted, regular rhythm RESPIRATORY: Effort and breath sounds normal, no problems with respiration noted ABDOMEN: Soft, nontender, nondistended, gravid. MUSCULOSKELETAL: Normal range of motion. No edema and no tenderness. 2+ distal pulses.  Cervical Exam: Dilatation 4cm   Effacement 70%    Presentation: cephalic FHT:  Baseline rate 140 bpm   Variability moderate  Accelerations present   Decelerations none Contractions: Every 1-2 mins   Pertinent Labs/Studies:   Results for orders placed or performed during the hospital encounter of 12/01/18 (from the past 24 hour(s))  CBC     Status: Abnormal   Collection Time: 12/01/18  1:25 AM  Result Value Ref Range   WBC 16.2 (H) 4.0 - 10.5 K/uL   RBC 3.65 (L) 3.87 - 5.11 MIL/uL   Hemoglobin 8.9 (L) 12.0 - 15.0 g/dL   HCT 16.129.0 (L) 09.636.0 - 04.546.0 %   MCV 79.5 (L) 80.0 - 100.0 fL   MCH 24.4 (L) 26.0 - 34.0 pg   MCHC 30.7 30.0 - 36.0 g/dL   RDW 40.918.0 (H) 81.111.5 - 91.415.5 %   Platelets 491 (H) 150 - 400 K/uL   nRBC 0.0 0.0 - 0.2 %  Type and screen [facility]     Status: None (Preliminary result)   Collection Time: 12/01/18  1:25 AM  Result Value Ref Range   ABO/RH(D) A POS    Antibody Screen NEG    Sample Expiration 12/04/2018,2359    Unit Number [id number]    Blood Component Type RED CELLS,LR    Unit division 00    Status of Unit ALLOCATED    Transfusion Status OK TO TRANSFUSE    Crossmatch Result      Compatible Performed at [facility], [address] [place], [place] [address]    Unit Number  [id number]    Blood Component Type RED CELLS,LR    Unit division 00    Status of Unit ALLOCATED    Transfusion Status OK TO TRANSFUSE    Crossmatch Result Compatible   Prepare RBC     Status: None   Collection Time: 12/01/18  2:30 AM  Result Value Ref Range   Order Confirmation      ORDER PROCESSED BY BLOOD BANK Performed at [facility], [place] [address] [place], [place] [address]     Assessment : Amber Dillon is a 33 y.o. [phone] at [redacted]w[redacted]d being admitted for repeat c-section. Patient with no prenatal care except for a visit to [facility]U last week. Was scheduled for RCS but did not show. Comes today for contractions, she is in latent labor. Will proceed with repeat c-section. Expressed a desire for BTL, counseled regarding BTL and IUD, patient desires IUD placement at the time of c-section.   The risks of cesarean section were discussed with the patient; including but not limited to: infection which may require antibiotics; bleeding which may require transfusion or re-operation; injury to bowel, bladder, ureters or other surrounding organs; need for additional procedures including hysterectomy in the event of a life-threatening hemorrhage; placental abnormalities wth subsequent pregnancies, risk of needing c-sections in future pregnancies, incisional problems, thromboembolic phenomenon and other postoperative/anesthesia complications. Answered all questions. The patient verbalized understanding of the plan, giving informed consent for the procedure. She is agreeable to blood transfusion in the event of emergency. Reviewed risks/benefits of IUD, risk of malpositioning, misplacement, risk of it falling out, <1% risk of pregnancy, reversibility, possibility of lighter periods. She is agreeable to IUD placement, consent signed.  Patient on suboxone, denies other medications.   Patient has been NPO since 6 pm, she will remain NPO for procedure Anesthesia and OR aware Preoperative prophylactic  antibiotics and SCDs ordered on call to the OR  To OR when ready    K. Arvilla Meres, M.D. Attending Center for [employer] (Faculty Practice)  12/01/2018, 2:45 AM

## 2018-12-01 NOTE — Transfer of Care (Signed)
Immediate Anesthesia Transfer of Care Note  Patient: Amber Dillon  Procedure(s) Performed: CESAREAN SECTION (N/A )  Patient Location: PACU  Anesthesia Type:Spinal  Level of Consciousness: awake, alert  and oriented  Airway & Oxygen Therapy: Patient Spontanous Breathing  Post-op Assessment: Report given to RN and Post -op Vital signs reviewed and stable  Post vital signs: Reviewed and stable  Last Vitals:  Vitals Value Taken Time  BP 112/72 12/01/18 0430  Temp    Pulse 108 12/01/18 0433  Resp 16 12/01/18 0433  SpO2 94 % 12/01/18 0433  Vitals shown include unvalidated device data.  Last Pain:  Vitals:   12/01/18 0150  TempSrc:   PainSc: 9          Complications: No apparent anesthesia complications

## 2018-12-01 NOTE — Progress Notes (Signed)
CSW received call from [place] CPS SW Tyler Paul who reported that he was in route to meet with [name] and infant. CSW agreed to notify security. CSW will follow up with CPS SW to inquire about infant's discharge plan.  Neida Ellegood, LCSW Clinical Social Worker [facility] Cell#: [phone]    

## 2018-12-01 NOTE — Discharge Summary (Signed)
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 yo [id number] at 3978w2d was admitted in Latent Labor on 12/01/2018. She had no prenatal care, was seen in MAU on 11/19/18 and scheduled for repeat c-section on 11/22/18. Patient did not show for repeat c-section. She presented tonight with frequent, painful contractions in early labor. She was taken to OR for unscheduled repeat c-section. Also had Liletta IUD placed at the time of c-section.  Membrane Rupture Time/Date: 3:33 AM ,12/01/2018   The patient went for cesarean section due to h/o c-section x2, and delivered a Viable infant,12/01/2018    Details of operation can be found in separate operative note. Patient had an uncomplicated postpartum course.  She is ambulating,tolerating a regular diet, passing flatus, and urinating well.  Patient is discharged home in stable condition 12/03/18.  Magnesium Sulfate recieved: No BMZ received: No  Physical exam  Vitals:   12/02/18 0449 12/02/18 1500 12/02/18 2112 12/03/18 0504  BP: 105/66 107/65 113/71  110/75  Pulse: 88 75 99 86  Resp: 18 17 18 18   Temp: 98.2 F (36.8 C) 98.1 F (36.7 C) 98 F (36.7 C) 98.1 F (36.7 C)  TempSrc: Oral Oral Oral Oral  SpO2: 95%  98% 97%  Weight:      Height:       General: alert, cooperative and no distress Lochia: appropriate Uterine Fundus: firm Incision: Healing well with no significant drainage, No significant erythema, Dressing is clean, dry, and intact DVT Evaluation: No evidence of DVT seen on physical exam. Negative Homan's sign. No cords or calf tenderness. Labs: Lab Results  Component Value Date   WBC 15.9 (H) 12/02/2018   HGB 7.5 (L) 12/02/2018   HCT 24.7 (L) 12/02/2018   MCV 80.2 12/02/2018   PLT 522 (H) 12/02/2018   CMP Latest Ref Rng & Units 12/01/2018  Glucose 65 - 99 mg/dL -  BUN 7 - 18 mg/dL -  Creatinine 1.470.44 - 8.291.00 mg/dL 5.620.60  Sodium 130136 - 865145 mmol/L -  Potassium 3.5 - 5.1 mmol/L -  Chloride 98 - 107 mmol/L -  CO2 21 - 32 mmol/L -  Calcium 8.5 - 10.1 mg/dL -  Total Protein 6.4 - 8.2 g/dL -  Total Bilirubin 0.2 - 1.0 mg/dL -  Alkaline Phos Unit/L -  AST 15 - 37 Unit/L -  ALT U/L -    Discharge instruction: per After Visit Summary and "Baby and Me Booklet".  After visit meds:  Allergies as of 12/03/2018   No Known Allergies     Medication List    STOP taking these medications   cephALEXin 500 MG capsule Commonly known as: Keflex     TAKE these medications   buprenorphine-naloxone 8-2 mg Subl SL tablet Commonly known as: SUBOXONE Place 1 tablet under the tongue 2 (two) times a day.   ibuprofen 800 MG tablet Commonly known as: ADVIL Take 1 tablet (800 mg total) by mouth every 8 (eight) hours.   oxyCODONE-acetaminophen 5-325 MG tablet Commonly known as: Percocet Take 1 tablet by mouth every 4 (four) hours as needed for severe pain.   prenatal multivitamin Tabs tablet Take 1 tablet by mouth daily at 12 noon.       Diet: routine diet  Activity: Advance as tolerated. Pelvic rest for 6 weeks.    Outpatient follow up:2 weeks Follow up Appt: Future Appointments  Date Time Provider [place]  12/15/2018  9:00 AM [place]  12/15/2018 11:00 AM [place]  12/29/2018  9:35 AM [place], CNM [facility]   Follow up Visit: [place] for [facility]. Go on 12/15/2018.   Specialty: Obstetrics and Gynecology Why: 9am Contact information: [address] [place], [place] [id number] [place] [address] [phone]           Please schedule this patient for Postpartum visit in: 4 weeks with the following provider: Any provider For C/S patients schedule nurse incision check in weeks 2 weeks: yes High risk pregnancy complicated by: no prenatal care Delivery mode:  CS Anticipated Birth Control:  IUD done with c-section PP Procedures needed: Incision check , string check Schedule Integrated BH visit: yes   Newborn Data: Live born female  Birth Weight:   APGAR: ,   Newborn Delivery   Birth date/time: 12/01/2018 03:34:00 Delivery type: C-Section, Low Vertical Trial of labor: No C-section categorization: Repeat      Baby Feeding: Bottle Disposition:NICU  Truett Mainland, DO

## 2018-12-01 NOTE — Op Note (Signed)
Amber Dillon PROCEDURE DATE: 12/01/2018  PREOPERATIVE DIAGNOSES: Intrauterine pregnancy at 6342w2d weeks gestation; prior c-section x2, desiring IUD for contraception  POSTOPERATIVE DIAGNOSES: The same  PROCEDURE: Repeat Low Transverse Cesarean Section, Liletta IUD placement  SURGEON:  K. Therese SarahMeryl , MD  ASSISTANT:  none  ANESTHESIOLOGY TEAM: Anesthesiologist: Lowella CurbMiller, Warren Ray, MD CRNA: Armanda HeritageSterling, Charlesetta M, CRNA  INDICATIONS: Amber ArenasBrittany N Gaza is a 33 y.o. [phone] at 3242w2d here for cesarean section secondary to the indications listed under preoperative diagnoses; please see preoperative note for further details.  The risks of cesarean section were discussed with the patient including but were not limited to: bleeding which may require transfusion or reoperation; infection which may require antibiotics; injury to bowel, bladder, ureters or other surrounding organs; injury to the fetus; need for additional procedures including hysterectomy in the event of a life-threatening hemorrhage; placental abnormalities wth subsequent pregnancies, incisional problems, thromboembolic phenomenon and other postoperative/anesthesia complications.  She consents to blood transfusion in the event of an emergency. The patient verbalized understanding of the plan, giving informed written consent for the procedure.    She was counseled regarding the risks/benefits of IUD including insertion risk of infection, hemorrhage, damage to surrounding tissue and organs, uterine perforation. She was counseled regarding risks of IUD including implantation into uterine wall, migration outside of uterus, possible need for hysteroscopic or laparoscopic removal, expulsion. Reviewed that she is also at slightly higher risk for ectopic pregnancy and she should take a pregnancy test if she believes she may be pregnant. She was advised to use backup method of protection for one week. She verbalized understanding of all of the above  and consent signed.   FINDINGS:  Viable female infant in cephalic presentation.  Apgars pending, infant to NICU for respiratory distress.  Clear amniotic fluid with terminal meconium at the time of delivery. Intact placenta, three vessel cord.  Normal uterus, with bladder densely adhered high on lower uterine segment, normal fallopian tubes and ovaries bilaterally.  ANESTHESIA: Spinal INTRAVENOUS FLUIDS: 600 ml   ESTIMATED BLOOD LOSS: 517 ml URINE OUTPUT:  200 ml SPECIMENS: Placenta sent to pathology COMPLICATIONS: None immediate  PROCEDURE IN DETAIL:  The patient preoperatively received intravenous antibiotics and had sequential compression devices applied to her lower extremities.  She was then taken to the operating room where spinal anesthesia was administered and was found to be adequate. She was then placed in a dorsal supine position with a leftward tilt. She was prepped and draped in a sterile manner.  A foley catheter was placed into her bladder with sterile technique and attached to constant gravity.  After a timeout was performed, a Pfannenstiel skin incision was made with scalpel over her preexisting scar and carried through to the underlying layer of fascia. The fascia was incised in the midline, and this incision was extended bilaterally using the Mayo scissors.  Kocher clamps were applied to the superior aspect of the fascial incision and the underlying rectus muscles were dissected off bluntly & sharply.  A similar process was carried out on the inferior aspect of the fascial incision. The rectus muscles were separated in the midline bluntly and the peritoneum was entered bluntly. The peritoneal incision was carefully extended bluntly laterally and caudad with good visualization of the bladder. The uterus appeared normal with bladder densely tacked onto lower uterine segment. The Alexis O-ring retractor was placed into the incision, taking care not to incorporate bowel or omentum. The  bladder blade was inserted. Attention was turned to the  lower uterine segment where a bladder flap was attempted but unable to be made due to dense adhesion of bladder to anterior lower uterine segment. A mid-low transverse hysterotomy was made with a scalpel and extended bilaterally bluntly.  The infant was delivered from OP position, nose and mouth were bulb suctioned, and the cord clamped and cut after 1 minute. The infant was then handed over to the waiting neonatology team. Uterine massage was then performed, and the placenta delivered intact with a three-vessel cord. The uterus was then cleared of clots and debris.  The hysterotomy was closed with 0 Vicryl in a running locked fashion. Once the hysterotomy closure was completed approximately halfway across, the Liletta was removed from the packaging in a sterile fashion. The strings were trimmed and the IUD was removed from the inserted. It was placed at the fundus using a ring forceps and the strings were tucked towards the cervix. The hysterotomy closure was completed, taking care not to incorporate the IUD strings into the hysterotomy. Once this was completed, a second layer was also placed with 0 Vicryl. Figure-of-eight 0 Vicryl serosal stitches were placed to help with hemostasis.  The fallopian tubes and ovaries were visualized bilaterally and normal appearing. The Alexis retractor was removed.  The pelvis was cleared of all clot and debris. Surgicel placed over the hysterotomy for additional hemostasis.  Hemostasis was again confirmed on all surfaces. The fascia was then closed using 0 PDS in a running fashion.  The subcutaneous layer was irrigated, then reapproximated with 2-0 plain gut, and 30 ml of 0.5% Marcaine was injected subcutaneously around the incision.  The skin was closed with a 4-0 Monocryl subcuticular stitch. The patient tolerated the procedure well. Sponge, lap, instrument and needle counts were correct x 3.  She was taken to the  recovery room in stable condition.    Feliz Beam, M.D. Attending [place], [facility] [place] for [employer], [place]

## 2018-12-01 NOTE — Progress Notes (Signed)
CSW acknowledged consult and attempted to meet with [name].  [name] was being attended to by bedside nurse when CSW arrived.  CSW will attempt to meet with [name] at a later time.  Heddy Vidana, LCSW Clinical Social Worker [facility] Cell#: [phone] 

## 2018-12-02 LAB — CBC
HCT: 24.7 % — ABNORMAL LOW (ref 36.0–46.0)
Hemoglobin: 7.5 g/dL — ABNORMAL LOW (ref 12.0–15.0)
MCH: 24.4 pg — ABNORMAL LOW (ref 26.0–34.0)
MCHC: 30.4 g/dL (ref 30.0–36.0)
MCV: 80.2 fL (ref 80.0–100.0)
Platelets: 522 10*3/uL — ABNORMAL HIGH (ref 150–400)
RBC: 3.08 MIL/uL — ABNORMAL LOW (ref 3.87–5.11)
RDW: 18.3 % — ABNORMAL HIGH (ref 11.5–15.5)
WBC: 15.9 10*3/uL — ABNORMAL HIGH (ref 4.0–10.5)
nRBC: 0 % (ref 0.0–0.2)

## 2018-12-02 MED ORDER — SODIUM CHLORIDE 0.9 % IV SOLN
510.0000 mg | Freq: Once | INTRAVENOUS | Status: AC
  Administered 2018-12-02: 510 mg via INTRAVENOUS
  Filled 2018-12-02: qty 17

## 2018-12-02 NOTE — Progress Notes (Signed)
Post Partum Day 1 Subjective: Doing well. States having some incisional pain and tired but otherwise okay.Has been up to use bathroom. Infant has been in NICU for respiratory issues, she has not felt like visiting yet because of discomfort.  Objective: Blood pressure 105/66, pulse 88, temperature 98.2 F (36.8 C), temperature source Oral, resp. rate 18, height 5\' 9"  (1.753 m), weight 73.9 kg, last menstrual period 02/15/2018, SpO2 95 %, unknown if currently breastfeeding.  Physical Exam:  General: tired-appearing, NAD Lochia: appropriate Uterine Fundus: firm Incision: pressure dressing still in place, C/D/I DVT Evaluation: No significant calf/ankle edema.  Recent Labs    12/01/18 0125 12/02/18 0446  HGB 8.9* 7.5*  HCT 29.0* 24.7*    Assessment/Plan: Plan for discharge tomorrow  SW consult given UDS positive for cocaine HgB dropped about a point, asymptomatic but will give IV feraheme Continue routine postpartum care, doing well postoperatively, pressure dressing to be removed today   LOS: 1 day   Lynell Greenhouse S Zyion Leidner 12/02/2018, 7:32 AM

## 2018-12-03 MED ORDER — PRENATAL MULTIVITAMIN CH: 1.0000 | ORAL_TABLET | Freq: Every day | ORAL | 1 refills | Status: AC

## 2018-12-03 MED ORDER — IBUPROFEN 800 MG PO TABS: 800.0000 mg | ORAL_TABLET | Freq: Three times a day (TID) | ORAL | 0 refills | Status: DC

## 2018-12-03 MED ORDER — OXYCODONE-ACETAMINOPHEN 5-325 MG PO TABS: 1.0000 | ORAL_TABLET | ORAL | 0 refills | Status: DC | PRN

## 2018-12-03 MED ORDER — PRENATAL MULTIVITAMIN CH: 1.0000 | ORAL_TABLET | Freq: Every day | ORAL | 1 refills | Status: DC

## 2018-12-03 MED ORDER — OXYCODONE-ACETAMINOPHEN 5-325 MG PO TABS: 1.0000 | ORAL_TABLET | ORAL | 0 refills | Status: AC | PRN

## 2018-12-03 MED ORDER — IBUPROFEN 800 MG PO TABS: 800.0000 mg | ORAL_TABLET | Freq: Three times a day (TID) | ORAL | 0 refills | Status: AC

## 2018-12-03 NOTE — Discharge Instructions (Signed)

## 2018-12-03 NOTE — Progress Notes (Signed)
Patient ID: Amber Dillon, female   DOB: [birth year], 33 y.o.   MRN: [id number]   Contacted by nurse to change prescriptions to her a new pharmacy in [place]. I have sent her prescription to this pharmacy and cancelled the prescriptions sent to the [facility] via telephone call.   Caren Macadam, MD

## 2018-12-04 LAB — TYPE AND SCREEN
ABO/RH(D): A POS
Antibody Screen: NEGATIVE
Unit division: 0
Unit division: 0

## 2018-12-04 LAB — BPAM RBC
Blood Product Expiration Date: 202007242359
Blood Product Expiration Date: 202007242359
Unit Type and Rh: 6200
Unit Type and Rh: 6200

## 2018-12-15 ENCOUNTER — Other Ambulatory Visit: Payer: Self-pay

## 2018-12-15 ENCOUNTER — Ambulatory Visit: Payer: Medicaid Other

## 2018-12-15 ENCOUNTER — Ambulatory Visit: Payer: Medicaid Other | Admitting: Clinical

## 2018-12-15 DIAGNOSIS — Z5329 Procedure and treatment not carried out because of patient's decision for other reasons: Secondary | ICD-10-CM

## 2018-12-15 DIAGNOSIS — Z91199 Patient's noncompliance with other medical treatment and regimen due to unspecified reason: Secondary | ICD-10-CM

## 2018-12-15 NOTE — BH Specialist Note (Signed)
Pt did not arrive to Webex video visit, and did not answer the phone as "your call cannot be completed at this time" after two attempts. Pt does not have MyChart set up and phone call did not go through, so no message left for patient.   [place] via Telemedicine Video Visit  12/15/2018 Amber Dillon [id number]  [name]

## 2018-12-29 ENCOUNTER — Encounter: Payer: Self-pay | Admitting: Advanced Practice Midwife

## 2018-12-29 ENCOUNTER — Other Ambulatory Visit: Payer: Self-pay

## 2018-12-29 ENCOUNTER — Telehealth: Payer: Self-pay | Admitting: Advanced Practice Midwife

## 2018-12-29 ENCOUNTER — Telehealth: Payer: Medicaid Other | Admitting: Advanced Practice Midwife

## 2018-12-29 DIAGNOSIS — Z91199 Patient's noncompliance with other medical treatment and regimen due to unspecified reason: Secondary | ICD-10-CM

## 2018-12-29 DIAGNOSIS — Z5329 Procedure and treatment not carried out because of patient's decision for other reasons: Secondary | ICD-10-CM

## 2018-12-29 NOTE — Telephone Encounter (Signed)
Attempted to contact patient about her missed appointment on 7/27. No answer, left voicemail for patient to give the office a call back to be rescheduled. No show letter mailed.

## 2018-12-29 NOTE — Progress Notes (Signed)
[place] not connected virtually.   I called [place] for her virtual visit and left a message I am calling for her virtual visit and will try again in a few minutes. Please conect virtually or be available by phone. Linda,RN [id number]  [place] not connected virtually.   I called [place] for her virtual visit and left a message I am calling for her virtual visit and since we have not connected virtually she will need to be rescheduled, please call the office to reschedule. Linda,RN

## 2019-08-26 IMAGING — US US MFM OB LIMITED
1 series · 15 of 28 positions shown · non-contrast
Comparison: none

[Series 1: us mfm ob limited · 15 of 34 slices shown]
[im 1/34]
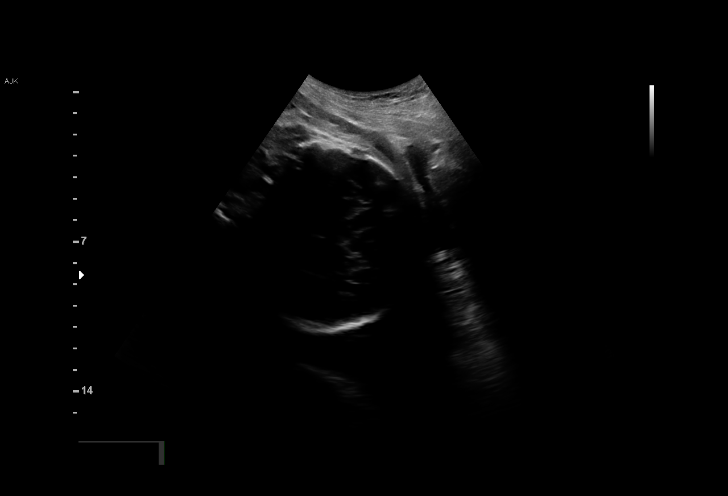
[im 3/34]
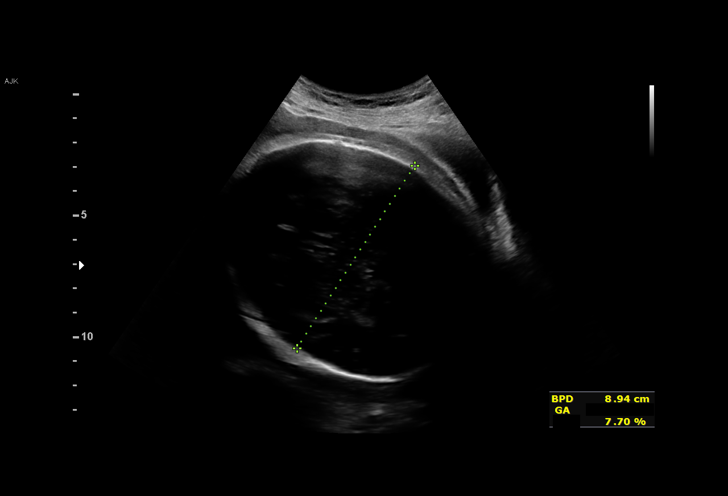
[im 5/34]
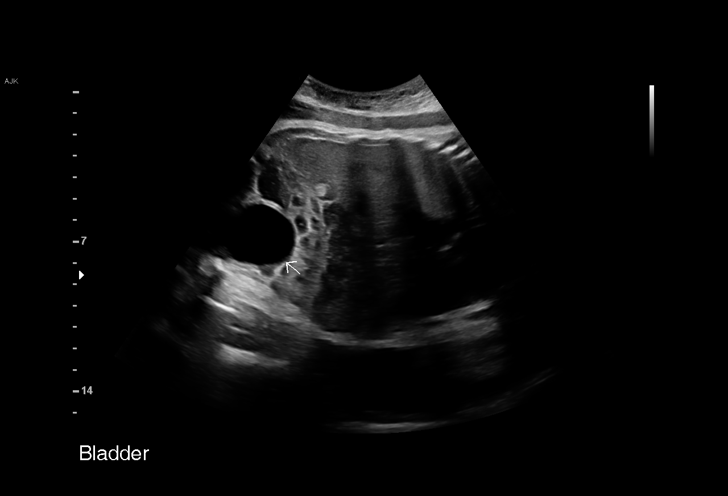
[im 8/34]
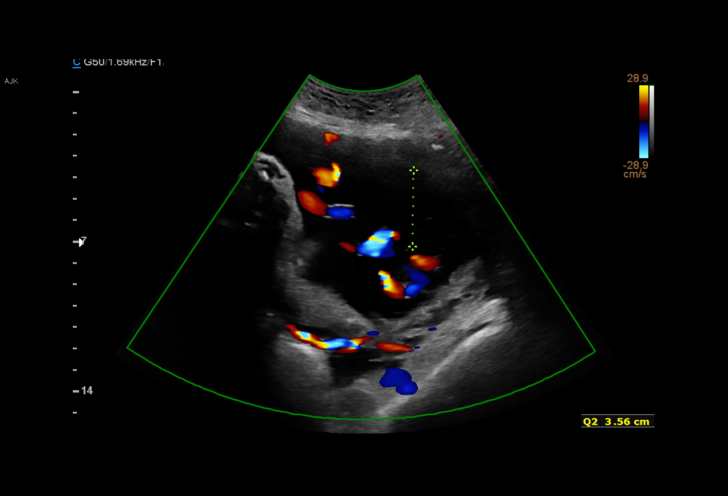
[im 10/34]
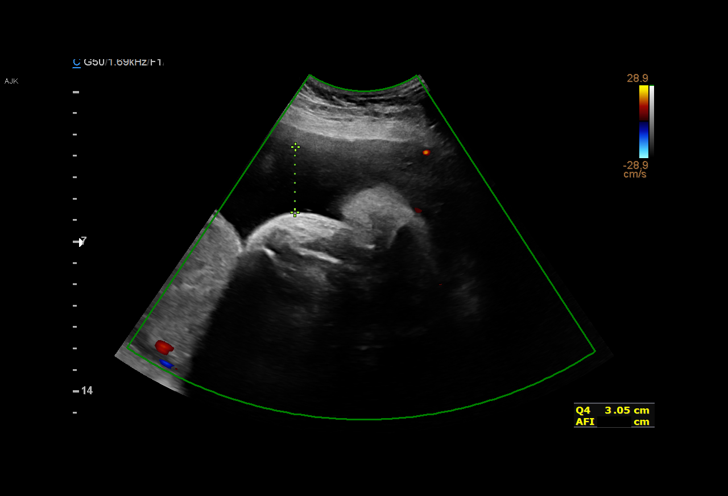
[im 13/34]
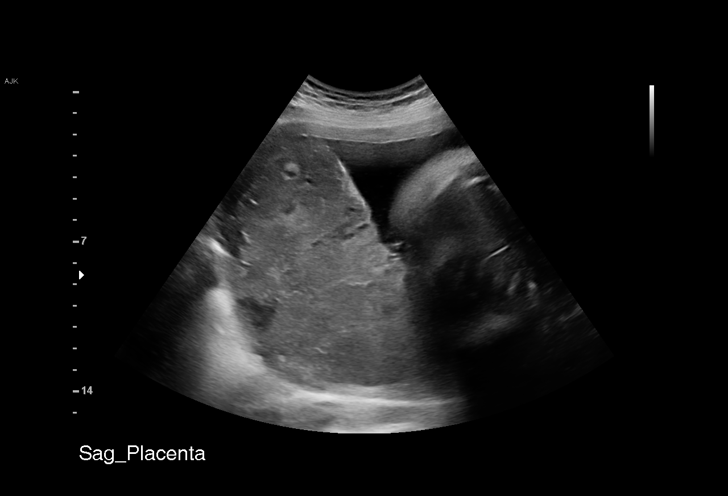
[im 15/34]
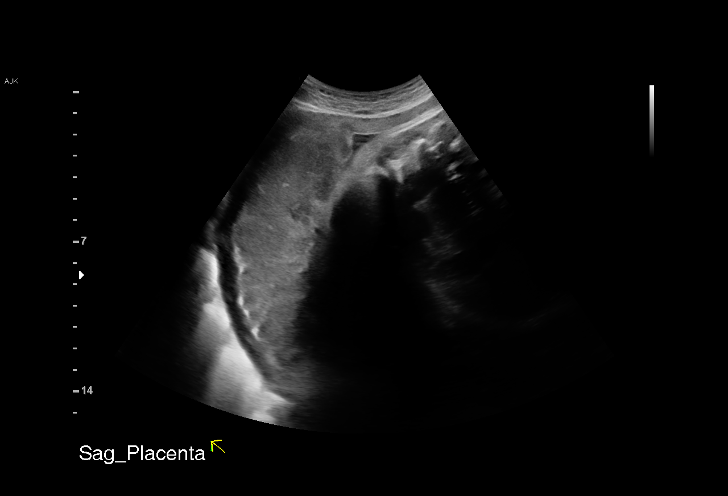
[im 18/34]
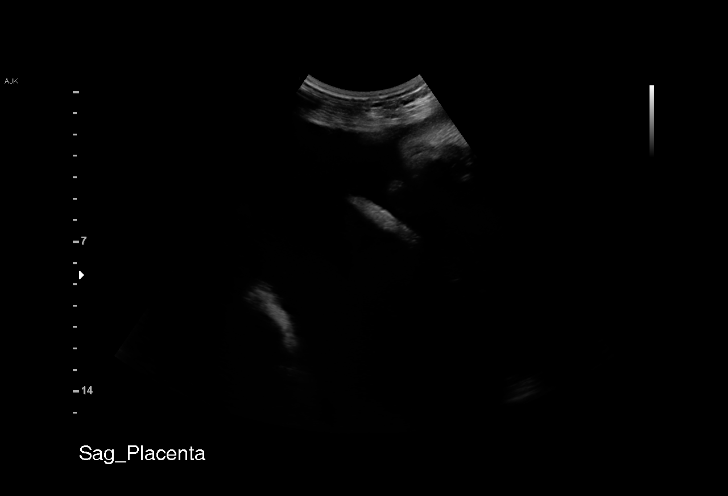
[im 19/34]
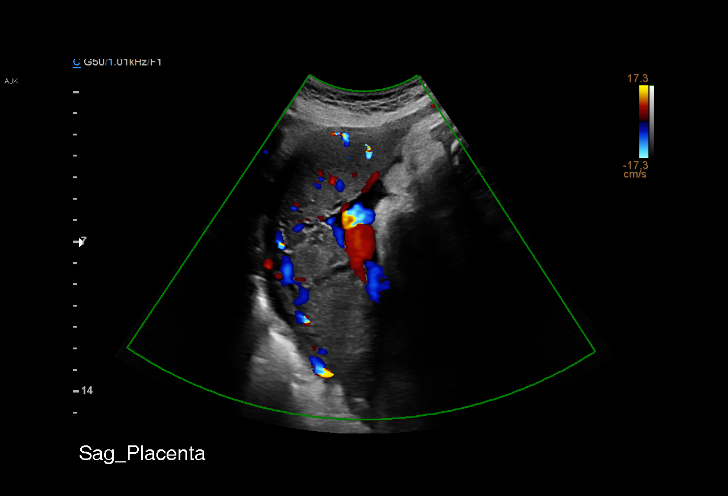
[im 21/34]
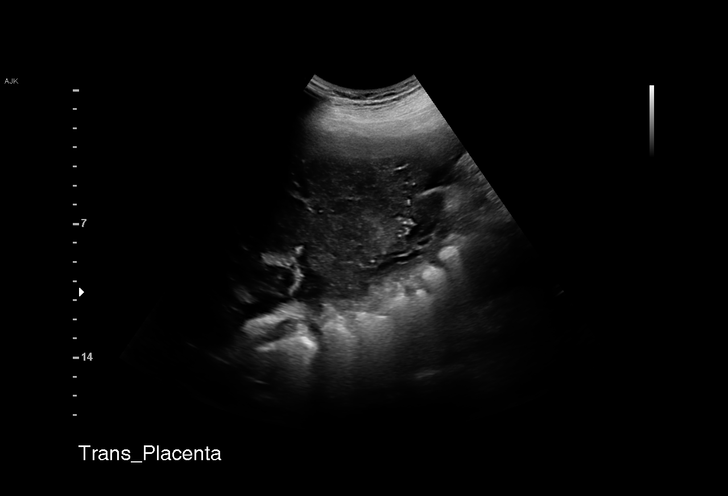
[im 24/34]
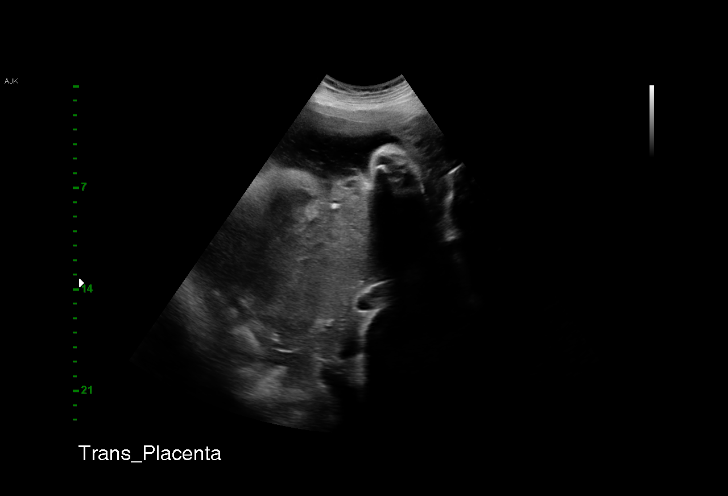
[im 26/34]
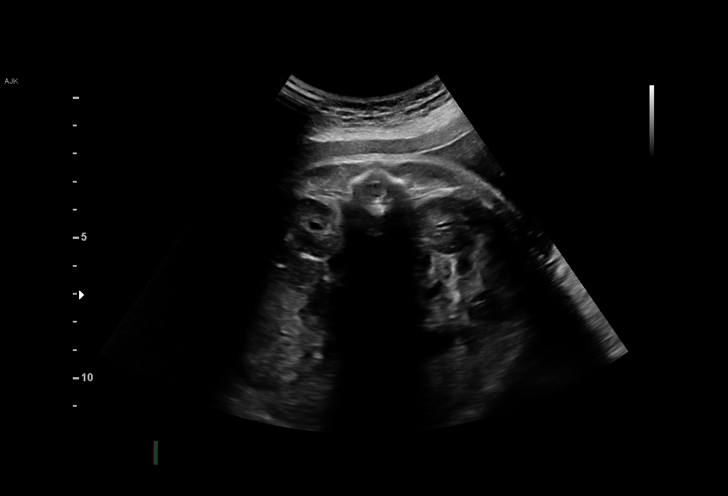
[im 29/34]
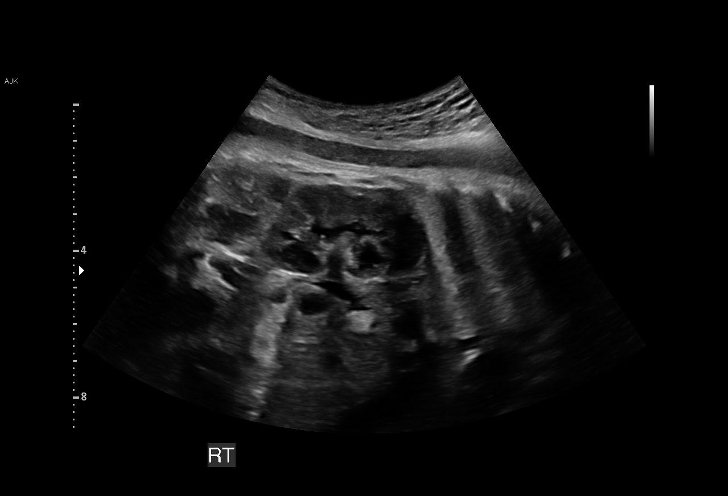
[im 31/34]
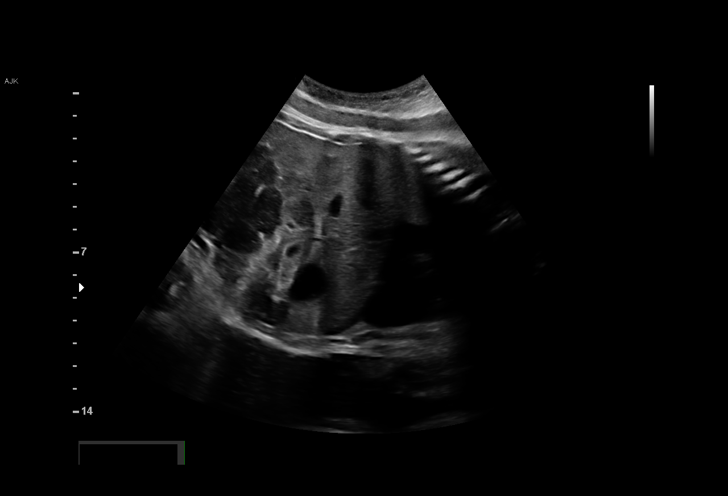
[im 34/34]
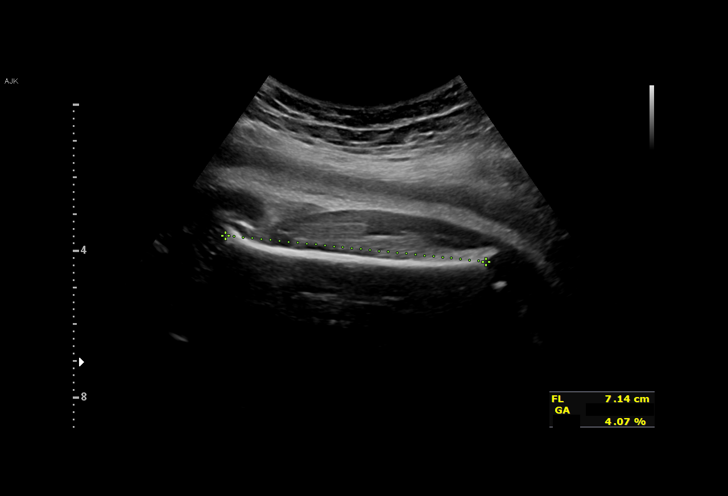

[15 of 28 positions shown; findings below may reference images not displayed]

1  US MFM OB LIMITED                    76815.01     THIBS MONCHY
 ----------------------------------------------------------------------

 ----------------------------------------------------------------------
Indications

  Insufficient Prenatal Care (no PNC)
  Previous cesarean delivery, antepartum
  Short interval between pregancies, 3rd
  trimester (C-section [DATE])
  39 weeks gestation of pregnancy
 ----------------------------------------------------------------------
Fetal Evaluation

 Num Of Fetuses:          1
 Fetal Heart Rate(bpm):   129
 Cardiac Activity:        Observed
 Presentation:            Cephalic
 Placenta:                Posterior
 P. Cord Insertion:       Visualized

 Amniotic Fluid
 AFI FV:      Within normal limits

 AFI Sum(cm)     %Tile       Largest Pocket(cm)
 15.61           65

 RUQ(cm)       RLQ(cm)       LUQ(cm)        LLQ(cm)


 Comment:    Stomach, bladder, kidneys, and diaphragm noted. Breathing and
             movement also noted.
Biometry

 BPD:      89.6  mm     G. Age:  36w 2d          9  %
                                                         FL/BPD:      79.7  %    71 - 87
 FL:       71.4  mm     G. Age:  36w 4d          5  %
OB History
 Gravidity:    4         Term:   3
Gestational Age

 LMP:           39w 4d        Date:  02/15/18                 EDD:   11/22/18
 U/S Today:     36w 3d                                        EDD:   12/14/18
 Best:          39w 4d     Det. By:  LMP  (02/15/18)          EDD:   11/22/18
Impression

 Limited exam
 Normal amniotic fluid
 No evidence of placenta previa
Recommendations

 Follow up as clinically indicated.
